# Patient Record
Sex: Female | Born: 1975
Health system: Southern US, Community
[De-identification: ages and names within clinical notes are randomized; demographics above are authoritative.]

## PROBLEM LIST (undated history)

## (undated) DIAGNOSIS — F419 Anxiety disorder, unspecified: Secondary | ICD-10-CM

## (undated) DIAGNOSIS — E785 Hyperlipidemia, unspecified: Secondary | ICD-10-CM

## (undated) DIAGNOSIS — K219 Gastro-esophageal reflux disease without esophagitis: Secondary | ICD-10-CM

## (undated) DIAGNOSIS — G4730 Sleep apnea, unspecified: Secondary | ICD-10-CM

## (undated) DIAGNOSIS — F32A Depression, unspecified: Secondary | ICD-10-CM

## (undated) DIAGNOSIS — F411 Generalized anxiety disorder: Secondary | ICD-10-CM

## (undated) DIAGNOSIS — M797 Fibromyalgia: Secondary | ICD-10-CM

## (undated) DIAGNOSIS — D649 Anemia, unspecified: Secondary | ICD-10-CM

## (undated) DIAGNOSIS — Z8619 Personal history of other infectious and parasitic diseases: Secondary | ICD-10-CM

## (undated) DIAGNOSIS — F329 Major depressive disorder, single episode, unspecified: Secondary | ICD-10-CM

## (undated) DIAGNOSIS — K2970 Gastritis, unspecified, without bleeding: Secondary | ICD-10-CM

## (undated) HISTORY — DX: Gastritis, unspecified, without bleeding: K29.70

## (undated) HISTORY — DX: Anemia, unspecified: D64.9

## (undated) HISTORY — DX: Gastro-esophageal reflux disease without esophagitis: K21.9

## (undated) HISTORY — DX: Anxiety disorder, unspecified: F41.9

## (undated) HISTORY — DX: Major depressive disorder, single episode, unspecified: F32.9

## (undated) HISTORY — DX: Hyperlipidemia, unspecified: E78.5

## (undated) HISTORY — DX: Generalized anxiety disorder: F41.1

## (undated) HISTORY — DX: Depression, unspecified: F32.A

## (undated) HISTORY — DX: Personal history of other infectious and parasitic diseases: Z86.19

## (undated) HISTORY — DX: Sleep apnea, unspecified: G47.30

---

## 2015-06-08 ENCOUNTER — Encounter (HOSPITAL_BASED_OUTPATIENT_CLINIC_OR_DEPARTMENT_OTHER): Payer: Self-pay

## 2015-06-08 ENCOUNTER — Emergency Department (HOSPITAL_BASED_OUTPATIENT_CLINIC_OR_DEPARTMENT_OTHER)
Admission: EM | Admit: 2015-06-08 | Discharge: 2015-06-08 | Disposition: A | Discharge status: Home / Self Care | Payer: Self-pay | Attending: Emergency Medicine | Admitting: Emergency Medicine

## 2015-06-08 DIAGNOSIS — Z8739 Personal history of other diseases of the musculoskeletal system and connective tissue: Secondary | ICD-10-CM | POA: Insufficient documentation

## 2015-06-08 DIAGNOSIS — J029 Acute pharyngitis, unspecified: Secondary | ICD-10-CM | POA: Insufficient documentation

## 2015-06-08 DIAGNOSIS — Z3202 Encounter for pregnancy test, result negative: Secondary | ICD-10-CM | POA: Insufficient documentation

## 2015-06-08 HISTORY — DX: Fibromyalgia: M79.7

## 2015-06-08 LAB — URINALYSIS, ROUTINE W REFLEX MICROSCOPIC
BILIRUBIN URINE: NEGATIVE
Glucose, UA: NEGATIVE mg/dL
Hgb urine dipstick: NEGATIVE
Ketones, ur: 15 mg/dL — AB
NITRITE: NEGATIVE
PH: 7.5 (ref 5.0–8.0)
Protein, ur: NEGATIVE mg/dL
SPECIFIC GRAVITY, URINE: 1.014 (ref 1.005–1.030)

## 2015-06-08 LAB — PREGNANCY, URINE: PREG TEST UR: NEGATIVE

## 2015-06-08 LAB — URINE MICROSCOPIC-ADD ON

## 2015-06-08 MED ORDER — KETOROLAC TROMETHAMINE 60 MG/2ML IM SOLN
30.0000 mg | Freq: Once | INTRAMUSCULAR | Status: AC
  Administered 2015-06-08: 30 mg via INTRAMUSCULAR
  Filled 2015-06-08: qty 2

## 2015-06-08 MED ORDER — IBUPROFEN 600 MG PO TABS: 600.0000 mg | ORAL_TABLET | Freq: Four times a day (QID) | ORAL | Status: DC | PRN

## 2015-06-08 NOTE — ED Notes (Addendum)
Step not collect pt able to open mouth but biting on swab abd and tongue depresser, pushing my hands away, unable to get strep

## 2015-06-08 NOTE — ED Provider Notes (Signed)
CSN: [id number]     Arrival date & time 06/08/15  1252 History   First MD Initiated Contact with Patient 06/08/15 1343     Chief Complaint  Patient presents with  . Sore Throat     (Consider location/radiation/quality/duration/timing/severity/associated sxs/prior Treatment) HPI Mary Powers is a 40 y.o. female who comes in for evaluation of sore throat. Patient is Spanish speaking and interpretation given by husband at bedside. Reports patient has had sore throat gradually worsening over the past one week. Reports associated nausea, decreased by mouth intake. Has not tried anything to improve symptoms. Swallowing worsened symptoms. No fevers, chills, shortness of breath, difficulty swallowing. No change in phonation. No drooling. No other modifying factors.  Past Medical History  Diagnosis Date  . Fibromyalgia    History reviewed. No pertinent past surgical history. No family history on file. Social History  Substance Use Topics  . Smoking status: Never Smoker   . Smokeless tobacco: None  . Alcohol Use: No   OB History    No data available     Review of Systems A 10 point review of systems was completed and was negative except for pertinent positives and negatives as mentioned in the history of present illness     Allergies  Review of patient's allergies indicates no known allergies.  Home Medications   Prior to Admission medications   Medication Sig Start Date End Date Taking? Authorizing Provider  ibuprofen (ADVIL,MOTRIN) 600 MG tablet Take 1 tablet (600 mg total) by mouth every 6 (six) hours as needed. 06/08/15   Zakirah Weingart, PA-C   BP 134/79 mmHg  Pulse 72  Temp(Src) 98 F (36.7 C) (Oral)  Resp 18  Ht 5' (1.524 m)  Wt 50.349 kg  BMI 21.68 kg/m2  SpO2 100%  LMP 06/03/2015 Physical Exam  Constitutional: She is oriented to person, place, and time. She appears well-developed and well-nourished.  HENT:  Head: Normocephalic and atraumatic.   Mouth/Throat: Oropharynx is clear and moist. No oropharyngeal exudate.  Mildly erythematous posterior oropharynx with no unilateral tonsillar swelling, trismus, glossal elevation. Mucous membranes are moist and she is tolerating secretions well. Mallampati 1  Eyes: Conjunctivae are normal. Pupils are equal, round, and reactive to light. Right eye exhibits no discharge. Left eye exhibits no discharge. No scleral icterus.  Neck: Normal range of motion. Neck supple.  Cardiovascular: Normal rate, regular rhythm and normal heart sounds.   Pulmonary/Chest: Effort normal and breath sounds normal. No respiratory distress. She has no wheezes. She has no rales.  Abdominal: Soft. There is no tenderness.  Musculoskeletal: She exhibits no tenderness.  Lymphadenopathy:    She has no cervical adenopathy.  Neurological: She is alert and oriented to person, place, and time.  Cranial Nerves II-XII grossly intact  Skin: Skin is warm and dry. No rash noted.  Psychiatric: She has a normal mood and affect.  Nursing note and vitals reviewed.   ED Course  Procedures (including critical care time) Labs Review Labs Reviewed  URINALYSIS, ROUTINE W REFLEX MICROSCOPIC (NOT AT [facility]) - Abnormal; Notable for the following:    Ketones, ur 15 (*)    Leukocytes, UA MODERATE (*)    All other components within normal limits  URINE MICROSCOPIC-ADD ON - Abnormal; Notable for the following:    Squamous Epithelial / LPF 0-5 (*)    Bacteria, UA FEW (*)    All other components within normal limits  RAPID STREP SCREEN (NOT AT [facility])  PREGNANCY, URINE    Imaging Review  No results found. I have personally reviewed and evaluated these images and lab results as part of my medical decision-making.   EKG Interpretation None     Meds given in ED:  Medications  ketorolac (TORADOL) injection 30 mg (30 mg Intramuscular Given 06/08/15 1420)    Discharge Medication List as of 06/08/2015  2:23 PM    START taking these  medications   Details  ibuprofen (ADVIL,MOTRIN) 600 MG tablet Take 1 tablet (600 mg total) by mouth every 6 (six) hours as needed., Starting 06/08/2015, Until Discontinued, Print       Filed Vitals:   06/08/15 1313 06/08/15 1425  BP: 131/73 134/79  Pulse: 121 72  Temp: 98.6 F (37 C) 98 F (36.7 C)  TempSrc: Oral Oral  Resp: 18 18  Height: 5' (1.524 m)   Weight: 50.349 kg   SpO2: 100% 100%    MDM  Pt afebrile without tonsillar exudate, cervical adenopathy. Low Centor criteria, no swab indicated. Diagnosis of viral pharyngitis. No abx indicated. DC w symptomatic tx for pain  Pt does not appear dehydrated, but did discuss importance of water rehydration. Presentation non concerning for PTA or infxn spread to soft tissue. No trismus or uvula deviation. Specific return precautions discussed. Pt able to drink water in ED without difficulty with intact air way. Recommended PCP follow up. Final diagnoses:  Sore throat        Joycie Peek, PA-C 06/08/15 1623  Melene Plan, DO 06/09/15 1805

## 2015-06-08 NOTE — ED Notes (Addendum)
Sore throat, decreased po intake x 1 week-not feeling well x 1 month-NAD-husband with pt-interpreter

## 2015-06-08 NOTE — ED Notes (Signed)
Pt unable to tolerate strep screen qtips due to nausea

## 2015-06-08 NOTE — Discharge Instructions (Signed)
Please take your medications as prescribed to help with your sore throat. [facility] in order to establish primary care. Return to ED for any new or worsening symptoms.  Dolor de garganta  (Sore Throat)  El dolor de garganta es el dolor, ardor, irritacin o sensacin de picazn en la garganta. Generalmente hay dolor o molestias al tragar o hablar. Un dolor de garganta puede estar acompaado de otros sntomas, como tos, estornudos, fiebre y ganglios hinchados en el cuello. Generalmente es Financial risk analyst signo de otra enfermedad, como un resfrio, gripe, anginas o mononucleosis (conocida como mono). La mayor parte de los dolores de garganta desaparecen sin tratamiento mdico. CAUSAS  Las causas ms comunes de dolor de garganta son:   Infecciones virales, como un resfrio, gripe o mononucleosis.  Infeccin bacteriana, como faringitis estreptoccica, amigdalitis, o tos ferina.  Alergias estacionales.  La sequedad en el aire.  Algunos irritantes, como el humo o la polucin.  Reflujo gastroesofgico. INSTRUCCIONES PARA EL CUIDADO EN EL HOGAR   Tome slo la medicacin que le indic el mdico.  Debe ingerir gran cantidad de lquido para mantener la orina de tono claro o color amarillo plido.  Descanse todo lo que sea necesario.  Trate de usar [employer] para la garganta, pastillas o chupe caramelos duros para Engineer, materials (si es mayor de 4 aos o segn lo que le indiquen).  Beba lquidos calientes, como caldos, infusiones de hierbas o agua caliente con miel para calmar el dolor momentneamente. Tambin puede comer o beber lquidos fros o congelados tales como paletas de hielo congelado.  Haga grgaras con agua con sal (mezclar 1 cucharadita de sal en 8 onzas [250 cm3] de agua).  No fume, y evite el humo de otros fumadores.  Ponga un humidificador de vapor fro en la habitacin por la noche para humedecer el aire. Tambin se puede activar en una  ducha de agua caliente y sentarse en el bao con la puerta cerrada durante 5-10 minutos. SOLICITE ATENCIN MDICA DE INMEDIATO SI:   Tiene dificultad para respirar.  No puede tragar lquidos, alimentos blandos, o su saliva.  Usted tiene ms inflamacin en la garganta.  El dolor de garganta no mejora en [address].  Tiene nuseas o vmitos.  Tiene fiebre o sntomas que persisten durante ms de 2 o 3 das.  Tiene fiebre y los sntomas empeoran de manera sbita. ASEGRESE DE QUE:   Comprende estas instrucciones.  Controlar su enfermedad.  Solicitar ayuda de inmediato si no mejora o si empeora.   Esta informacin no tiene Theme park manager el consejo del mdico. Asegrese de hacerle al mdico cualquier pregunta que tenga.   Document Released: 05/02/2005 Document Revised: 04/18/2012 Elsevier Interactive Patient Education [employer]

## 2015-06-10 ENCOUNTER — Encounter (HOSPITAL_BASED_OUTPATIENT_CLINIC_OR_DEPARTMENT_OTHER): Payer: Self-pay

## 2015-06-10 ENCOUNTER — Emergency Department (HOSPITAL_BASED_OUTPATIENT_CLINIC_OR_DEPARTMENT_OTHER)
Admission: EM | Admit: 2015-06-10 | Discharge: 2015-06-10 | Disposition: A | Discharge status: Home / Self Care | Payer: Self-pay | Attending: Emergency Medicine | Admitting: Emergency Medicine

## 2015-06-10 DIAGNOSIS — Z8739 Personal history of other diseases of the musculoskeletal system and connective tissue: Secondary | ICD-10-CM | POA: Insufficient documentation

## 2015-06-10 DIAGNOSIS — R070 Pain in throat: Secondary | ICD-10-CM | POA: Insufficient documentation

## 2015-06-10 DIAGNOSIS — R1310 Dysphagia, unspecified: Secondary | ICD-10-CM | POA: Insufficient documentation

## 2015-06-10 LAB — CBG MONITORING, ED: GLUCOSE-CAPILLARY: 95 mg/dL (ref 65–99)

## 2015-06-10 LAB — RAPID STREP SCREEN (MED CTR MEBANE ONLY): STREPTOCOCCUS, GROUP A SCREEN (DIRECT): NEGATIVE

## 2015-06-10 MED ORDER — DEXAMETHASONE 6 MG PO TABS
10.0000 mg | ORAL_TABLET | Freq: Once | ORAL | Status: AC
  Administered 2015-06-10: 10 mg via ORAL
  Filled 2015-06-10: qty 1

## 2015-06-10 NOTE — ED Notes (Signed)
Pt ambulating without any difficulty, wearing sunglasses and talking on phone.

## 2015-06-10 NOTE — ED Notes (Signed)
Husband reports pt is having chest pressure and hands are cold with shaking-pt was not able to tolerate strep scree yesterday-done today cooperation after discussion

## 2015-06-10 NOTE — ED Provider Notes (Signed)
CSN: [id number]     Arrival date & time 06/10/15  1953 History  By signing my name below, I, Linus Galas, attest that this documentation has been prepared under the direction and in the presence of Melene Plan, DO. Electronically Signed: Linus Galas, ED Scribe. 06/10/2015. 9:17 PM.   Chief Complaint  Patient presents with  . Multiple c/o    The history is provided by the spouse. A language interpreter was used (husband).   HPI Comments: Mary Powers is a 40 y.o. female with a PMHx of fibromyalgia who presents to the Emergency Department complaining of constant, ongoing sore throat for the past couple of months worsening 1 day ago. As per family, pt tried to eat something today and felt as if was getting stuck in her throat. Pt denies any exacerbating or alleviating factors. She also reports vomiting and body aches.Pt husbands states the pt was evaluated here yesterday for the same complaint and was sent home with an Rx for "throat spray" and ibuprofen. Pt denies fever, chills, cough, congestion, SOB, drooling or any other sx. Pt denies h/o HIV or AIDS  Past Medical History  Diagnosis Date  . Fibromyalgia    No past surgical history on file. No family history on file. Social History  Substance Use Topics  . Smoking status: Never Smoker   . Smokeless tobacco: None  . Alcohol Use: No   OB History    No data available     Review of Systems  Constitutional: Negative for fever and chills.  HENT: Positive for sore throat and trouble swallowing. Negative for congestion and rhinorrhea.   Eyes: Negative for redness and visual disturbance.  Respiratory: Negative for shortness of breath and wheezing.   Cardiovascular: Negative for chest pain and palpitations.  Gastrointestinal: Positive for vomiting. Negative for nausea.  Genitourinary: Negative for dysuria and urgency.  Musculoskeletal: Positive for myalgias. Negative for arthralgias.  Skin: Negative for pallor and wound.   Neurological: Negative for dizziness and headaches.   Allergies  Review of patient's allergies indicates no known allergies.  Home Medications   Prior to Admission medications   Medication Sig Start Date End Date Taking? Authorizing Provider  ibuprofen (ADVIL,MOTRIN) 600 MG tablet Take 1 tablet (600 mg total) by mouth every 6 (six) hours as needed. 06/08/15   Benjamin Cartner, PA-C   BP 130/71 mmHg  Pulse 108  Temp(Src) 98.7 F (37.1 C) (Oral)  Resp 18  Ht 5' (1.524 m)  Wt 111 lb (50.349 kg)  BMI 21.68 kg/m2  SpO2 100%  LMP 06/03/2015 Physical Exam  Constitutional: She is oriented to person, place, and time. She appears well-developed and well-nourished. No distress.  HENT:  Head: Normocephalic and atraumatic.  Mouth/Throat: Uvula is midline and oropharynx is clear and moist.  No drooling, no swelling in posterior oropharynx, no signs of poor oral dentition, no sublingual swelling   Eyes: EOM are normal. Pupils are equal, round, and reactive to light.  Neck: Normal range of motion. Neck supple.  Not in sniffing position, able to rotate neck without difficulty   Cardiovascular: Normal rate and regular rhythm.  Exam reveals no gallop and no friction rub.   No murmur heard. Pulmonary/Chest: Effort normal. She has no wheezes. She has no rales.  Abdominal: Soft. She exhibits no distension. There is no tenderness.  Musculoskeletal: She exhibits no edema or tenderness.  Lymphadenopathy:    She has no cervical adenopathy.  Neurological: She is alert and oriented to person, place, and time.  Skin: Skin is warm and dry. She is not diaphoretic.  Psychiatric: She has a normal mood and affect. Her behavior is normal.  Nursing note and vitals reviewed.   ED Course  Procedures  DIAGNOSTIC STUDIES: Oxygen Saturation is 100% on room air, normal by my interpretation.    COORDINATION OF CARE: 9:17 PM Will order EKG, rapid strep, CBG and culture. Discussed treatment plan with pt at  bedside and pt agreed to plan.   Labs Review Labs Reviewed  RAPID STREP SCREEN (NOT AT [facility])  CULTURE, GROUP A STREP ([facility])  CBG MONITORING, ED    Imaging Review No results found. I have personally reviewed and evaluated these images and lab results as part of my medical decision-making.   EKG Interpretation None      MDM   Final diagnoses:  None    40 yo F with sensation like she can swallow.  This has been going on for a couple months.  No infectious symptoms, not drooling, well appearing, non toxic.  Handling her secretions.  No signs of swelling or infectious process to the posterior oropharynx.  Patient recently seen here for same with negative strep.  As patient not drooling, feel she should be able to swallow, language barrier makes it difficult to obtain exact symptoms.  As process is not acute, feel unlike to be RPA, ludwigs, PTA, epiglottitis will have patient follow up with PCP with possible need to refer to ENT for scope.   I have discussed the diagnosis/risks/treatment options with the patient and family and believe the pt to be eligible for discharge home to follow-up with PCP. We also discussed returning to the ED immediately if new or worsening sx occur. We discussed the sx which are most concerning (e.g., sudden worsening pain, fever, inability to tolerate by mouth) that necessitate immediate return. Medications administered to the patient during their visit and any new prescriptions provided to the patient are listed below.  Medications given during this visit Medications  dexamethasone (DECADRON) tablet 10 mg (10 mg Oral Given 06/10/15 2134)    Discharge Medication List as of 06/10/2015  9:29 PM      The patient appears reasonably screen and/or stabilized for discharge and I doubt any other medical condition or other [facility] requiring further screening, evaluation, or treatment in the ED at this time prior to discharge.     I personally performed the services  described in this documentation, which was scribed in my presence. The recorded information has been reviewed and is accurate.     Melene Plan, DO 06/11/15 1407

## 2015-06-10 NOTE — Discharge Instructions (Signed)
Dolor sin causa aparente °(Pain Without a Known Cause) °¿QUÉ ES EL DOLOR SIN CAUSA APARENTE? °El dolor puede presentarse en cualquier parte del cuerpo y puede ser de leve a grave. En algunos casos, las causas del dolor se desconocen. Algunos tipos de dolor que pueden ocurrir sin causa aparente incluyen los siguientes:  °· Dolor de cabeza. °· Dolor de espalda. °· Dolor abdominal. °· Dolor en el cuello. °¿CÓMO SE DIAGNOSTICA EL DOLOR SIN CAUSA APARENTE?  °Su médico tratará de determinar la causa del dolor. Para esto, realizará lo siguiente: °· Examen físico. °· Historia clínica. °· Análisis de sangre. °· Análisis de orina. °· Radiografías. °Si no se descubre ninguna causa, el médico puede diagnosticarle "dolor sin causa aparente".  °¿HAY TRATAMIENTO PARA EL DOLOR SIN CAUSA APARENTE?  °El tratamiento depende del tipo de dolor que tenga. El médico puede recetar medicamentos para ayudar a calmar el dolor.  °¿QUÉ PUEDO HACER EN MI CASA PARA CONTROLAR EL DOLOR?  °· Tome los medicamentos solamente como se lo haya indicado el médico. °· Interrumpa las actividades que le causen dolor. En los momentos que sienta dolor intenso, haga reposo en cama. °· Trate de reducir el estrés realizando actividades, como yoga o meditación. Hable con su médico para que le dé otras recomendaciones sobre cómo reducir el estrés con la actividad física. °· Haga ejercicio regularmente si el médico lo autoriza. °· Siga una dieta saludable que incluya frutas y verduras. Esto puede mejorar el dolor. Hable con el médico si tiene alguna pregunta sobre la dieta. °¿QUÉ PASA SI EL DOLOR NO MEJORA?  °Si tiene mucho dolor y no se encuentran los motivos de dicho dolor o este empeora, es importante que realice un seguimiento con su médico. Puede ser necesario repetir las pruebas y realizar estudios más profundos para hallar la posible causa.  °  °Esta información no tiene como fin reemplazar el consejo del médico. Asegúrese de hacerle al médico cualquier  pregunta que tenga. °  °Document Released: 02/09/2005 Document Revised: 05/23/2014 °Elsevier Interactive Patient Education ©2016 Elsevier Inc. ° °

## 2015-06-10 NOTE — ED Notes (Signed)
Pt able to swallow pills with water without issue.  Verbalizes understanding of dc instructions and denies any further needs at this time.

## 2015-06-10 NOTE — ED Notes (Signed)
Per husband interpreter pt reports unable to swallow-pt is not drooling and is able to handle secretions-pt was seen yesterday for sore throat-pt NAD-steady gait

## 2015-06-10 NOTE — ED Notes (Signed)
Pt c/o painful swallowing and husband states she is unable to eat.  Pt's mucosa is moist, no drooling and she speaks without any issue.

## 2015-06-12 ENCOUNTER — Other Ambulatory Visit: Payer: Self-pay | Admitting: Internal Medicine

## 2015-06-12 DIAGNOSIS — R1310 Dysphagia, unspecified: Secondary | ICD-10-CM

## 2015-06-13 LAB — CULTURE, GROUP A STREP (THRC)

## 2015-06-15 ENCOUNTER — Ambulatory Visit
Admission: RE | Admit: 2015-06-15 | Discharge: 2015-06-15 | Disposition: A | Discharge status: Home / Self Care | Payer: No Typology Code available for payment source | Source: Ambulatory Visit | Attending: Internal Medicine | Admitting: Internal Medicine

## 2015-06-15 DIAGNOSIS — R1310 Dysphagia, unspecified: Secondary | ICD-10-CM

## 2015-08-22 ENCOUNTER — Emergency Department (HOSPITAL_BASED_OUTPATIENT_CLINIC_OR_DEPARTMENT_OTHER)
Admission: EM | Admit: 2015-08-22 | Discharge: 2015-08-23 | Disposition: A | Discharge status: Home / Self Care | Payer: No Typology Code available for payment source | Attending: Emergency Medicine | Admitting: Emergency Medicine

## 2015-08-22 ENCOUNTER — Encounter (HOSPITAL_BASED_OUTPATIENT_CLINIC_OR_DEPARTMENT_OTHER): Payer: Self-pay | Admitting: *Deleted

## 2015-08-22 DIAGNOSIS — Z8619 Personal history of other infectious and parasitic diseases: Secondary | ICD-10-CM | POA: Insufficient documentation

## 2015-08-22 DIAGNOSIS — Z8739 Personal history of other diseases of the musculoskeletal system and connective tissue: Secondary | ICD-10-CM | POA: Insufficient documentation

## 2015-08-22 DIAGNOSIS — Z3202 Encounter for pregnancy test, result negative: Secondary | ICD-10-CM | POA: Insufficient documentation

## 2015-08-22 DIAGNOSIS — R630 Anorexia: Secondary | ICD-10-CM | POA: Insufficient documentation

## 2015-08-22 DIAGNOSIS — R42 Dizziness and giddiness: Secondary | ICD-10-CM | POA: Insufficient documentation

## 2015-08-22 LAB — URINALYSIS, ROUTINE W REFLEX MICROSCOPIC
BILIRUBIN URINE: NEGATIVE
Glucose, UA: NEGATIVE mg/dL
Hgb urine dipstick: NEGATIVE
Ketones, ur: NEGATIVE mg/dL
LEUKOCYTES UA: NEGATIVE
NITRITE: NEGATIVE
PH: 7 (ref 5.0–8.0)
Protein, ur: NEGATIVE mg/dL
SPECIFIC GRAVITY, URINE: 1.004 — AB (ref 1.005–1.030)

## 2015-08-22 LAB — CBG MONITORING, ED: GLUCOSE-CAPILLARY: 75 mg/dL (ref 65–99)

## 2015-08-22 LAB — PREGNANCY, URINE: PREG TEST UR: NEGATIVE

## 2015-08-22 MED ORDER — SODIUM CHLORIDE 0.9 % IV BOLUS (SEPSIS)
1000.0000 mL | Freq: Once | INTRAVENOUS | Status: AC
  Administered 2015-08-22: 1000 mL via INTRAVENOUS

## 2015-08-22 MED ORDER — MECLIZINE HCL 25 MG PO TABS
25.0000 mg | ORAL_TABLET | Freq: Once | ORAL | Status: AC
  Administered 2015-08-23: 25 mg via ORAL
  Filled 2015-08-22: qty 1

## 2015-08-22 NOTE — ED Notes (Signed)
Pt here with husband who is interpreting. He states she was diagnosed with H-pylori a month ago and has had decreased appetite since. Yesterday was working with husband and became dizzy.

## 2015-08-22 NOTE — ED Provider Notes (Signed)
CSN: [id number]     Arrival date & time 08/22/15  2109 History   First MD Initiated Contact with Patient 08/22/15 2306     Chief Complaint  Patient presents with  . Dizziness    Mary Powers is a 40 y.o. female Who presents to the emergency department with her husband complaining of positional lightheadedness since yesterday. Patient reports she's been feeling lightheaded with position change as well as dizziness. She also thinks she has some room spinning intermittently.  Patient also reports for the past couple weeks she has had decreased appetite. She's not been eating and drinking as much as she usually does. She reports she was diagnosed with H. pylori and was treated for this. She reports since being treated for H. pylori she's had decreased appetite. Patient reports lightheadedness when she bends down and stands up or with other position change.  She denies fevers, numbness, weakness, headache, double vision, neck pain, chest pain, shortness of breath, abdominal pain, vomiting, syncope, diarrhea, or rashes.  Patient is a 40 y.o. female presenting with dizziness. The history is provided by the patient and the spouse. The history is limited by a language barrier. A language interpreter was used.  Dizziness Associated symptoms: no chest pain, no diarrhea, no headaches, no nausea, no shortness of breath, no vomiting and no weakness     Past Medical History  Diagnosis Date  . Fibromyalgia    History reviewed. No pertinent past surgical history. History reviewed. No pertinent family history. Social History  Substance Use Topics  . Smoking status: Never Smoker   . Smokeless tobacco: None  . Alcohol Use: No   OB History    No data available     Review of Systems  Constitutional: Negative for fever and chills.  HENT: Negative for congestion and sore throat.   Eyes: Negative for visual disturbance.  Respiratory: Negative for cough and shortness of breath.    Cardiovascular: Negative for chest pain.  Gastrointestinal: Negative for nausea, vomiting, abdominal pain and diarrhea.  Genitourinary: Negative for dysuria and difficulty urinating.  Musculoskeletal: Negative for back pain and neck pain.  Skin: Negative for rash.  Neurological: Positive for dizziness and light-headedness. Negative for syncope, weakness, numbness and headaches.      Allergies  Review of patient's allergies indicates no known allergies.  Home Medications   Prior to Admission medications   Medication Sig Start Date End Date Taking? Authorizing Provider  ibuprofen (ADVIL,MOTRIN) 600 MG tablet Take 1 tablet (600 mg total) by mouth every 6 (six) hours as needed. 06/08/15   Joycie PeekBenjamin Cartner, PA-C  meclizine (ANTIVERT) 25 MG tablet Take 1 tablet (25 mg total) by mouth 3 (three) times daily as needed for dizziness. 08/23/15   Everlene FarrierWilliam Kimie Pidcock, PA-C   BP 112/61 mmHg  Pulse 87  Temp(Src) 99.1 F (37.3 C) (Oral)  Resp 18  Ht 5\' 3"  (1.6 m)  Wt 46.267 kg  BMI 18.07 kg/m2  SpO2 100%  LMP 08/08/2015 Physical Exam  Constitutional: She is oriented to person, place, and time. She appears well-developed and well-nourished. No distress.  Nontoxic appearing.  HENT:  Head: Normocephalic and atraumatic.  Right Ear: External ear normal.  Left Ear: External ear normal.  Mouth/Throat: Oropharynx is clear and moist.  Eyes: Conjunctivae and EOM are normal. Pupils are equal, round, and reactive to light. Right eye exhibits no discharge. Left eye exhibits no discharge.  Neck: Normal range of motion. Neck supple. No JVD present. No tracheal deviation present.  Cardiovascular:  Normal rate, regular rhythm, normal heart sounds and intact distal pulses.  Exam reveals no gallop and no friction rub.   No murmur heard. Pulmonary/Chest: Effort normal and breath sounds normal. No respiratory distress. She has no wheezes. She has no rales.  Abdominal: Soft. She exhibits no distension. There is no  tenderness. There is no guarding.  Musculoskeletal: She exhibits no edema or tenderness.  Lymphadenopathy:    She has no cervical adenopathy.  Neurological: She is alert and oriented to person, place, and time. No cranial nerve deficit. Coordination normal.  The patient is alert and oriented 3. Cranial nerves are intact. Sensation intact her bilateral upper and lower extremities. Speech is clear and coherent. No pronator drift. Finger to nose intact bilaterally. Normal gait. Vision is grossly intact. EOMs are intact.  Skin: Skin is warm and dry. No rash noted. She is not diaphoretic. No erythema. No pallor.  Psychiatric: She has a normal mood and affect. Her behavior is normal.  Nursing note and vitals reviewed.   ED Course  Procedures (including critical care time) Labs Review Labs Reviewed  URINALYSIS, ROUTINE W REFLEX MICROSCOPIC (NOT AT [facility]) - Abnormal; Notable for the following:    Specific Gravity, Urine 1.004 (*)    All other components within normal limits  PREGNANCY, URINE  CBG MONITORING, ED    Imaging Review No results found. I have personally reviewed and evaluated these  lab results as part of my medical decision-making.   EKG Interpretation None     Filed Vitals:   08/22/15 2127 08/23/15 0009  BP: 119/56 112/61  Pulse: 96 87  Temp: 99.1 F (37.3 C)   TempSrc: Oral   Resp: 16 18  Height:  (1.6 m)   Weight: 46.267 kg   SpO2: 100% 100%    MDM   Meds given in ED:  Medications  sodium chloride 0.9 % bolus 1,000 mL (1,000 mLs Intravenous New Bag/Given 08/22/15 2359)  meclizine (ANTIVERT) tablet 25 mg (25 mg Oral Given 08/23/15 0011)    New Prescriptions   MECLIZINE (ANTIVERT) 25 MG TABLET    Take 1 tablet (25 mg total) by mouth 3 (three) times daily as needed for dizziness.    Final diagnoses:  Dizziness    This is a 40 y.o. female Who presents to the emergency department with her husband complaining of positional lightheadedness since yesterday.  Patient reports she's been feeling lightheaded with position change as well as dizziness. She also thinks she has some room spinning intermittently.  Patient also reports for the past couple weeks she has had decreased appetite. She's not been eating and drinking as much as she usually does. She reports she was diagnosed with H. pylori and was treated for this. She reports since being treated for H. pylori she's had decreased appetite. Patient reports lightheadedness when she bends down and stands up or with other position change.  She denies fevers, numbness, weakness, headache. On exam the patient is afebrile and nontoxic-appearing. She has no focal neurological deficits. She has normal gait. Initially I suspected dehydration with the patient reporting she is not drinking as much as usual. However, patient's urinalysis returned with a specific gravity of 1.004. This makes me think that dehydration is less likely. Pregnancy test is negative. Orthostatic vital signs did show that her heart rate increased from 72 while sitting to 101 while standing. She was not orthostatic. This does make me question dehydration again. Patient was provided with flu bolus and meclizine 25 mg.  At reevaluation patient reports feeling much better. She stood for me without becoming tachycardic. She denies feeling lightheaded or dizzy with this change in position. She is able to ambulate in the room with normal gait. I question if this is more vertigo. Will discharge with prescription for meclizine and I encouraged her to stay well hydrated and drink 6-8 glasses of water per day. I encouraged her to follow-up closely with her primary care provider. She reports she does have primary care but she is unsure of the name of her physician. I encouraged her to follow-up with him this week. I discussed her concerns specific return precautions. I advised the patient to follow-up with their primary care provider this week. I advised the patient  to return to the emergency department with new or worsening symptoms or new concerns. The patient verbalized understanding and agreement with plan.             Everlene Farrier, PA-C 08/23/15 0101  Paula Libra, MD 08/23/15 [phone]

## 2015-08-23 MED ORDER — MECLIZINE HCL 25 MG PO TABS: 25.0000 mg | ORAL_TABLET | Freq: Three times a day (TID) | ORAL | Status: DC | PRN

## 2015-08-23 NOTE — Discharge Instructions (Signed)
Vrtigo posicional benigno (Benign Positional Vertigo) El vrtigo es la sensacin de que usted o todo lo que lo rodea se mueven cuando en realidad eso no sucede. El vrtigo posicional benigno es el tipo de vrtigo ms comn. La causa de este trastorno no es grave (es benigna). Algunos movimientos y determinadas posiciones pueden desencadenar el trastorno (es posicional). El vrtigo posicional benigno puede ser peligroso si ocurre mientras est haciendo algo que podra suponer un riesgo para usted y para los dems, por ejemplo, conduciendo un automvil.  CAUSAS En muchos de los [place], se desconoce la causa de este trastorno. Puede deberse a Passenger transport manager zona del odo interno que ayuda al cerebro a percibir el movimiento y a Neurosurgeon equilibrio. Esta alteracin puede deberse a una infeccin viral (laberintitis), a una lesin en la cabeza o a los movimientos reiterados. FACTORES DE RIESGO Es ms probable que esta afeccin se manifieste en:  Las mujeres.  Mary Powers. SNTOMAS Generalmente, los sntomas de este trastorno se presentan al mover la cabeza o los ojos en diferentes direcciones. Pueden aparecer repentinamente y suelen durar menos de un minuto. Entre los sntomas se pueden incluir los siguientes:  Prdida del equilibrio y cadas.  Sensacin de estar dando vueltas o movindose.  Sensacin de que el entorno est dando vueltas o movindose.  Nuseas y vmitos.  Visin borrosa.  Mareos.  Movimientos oculares involuntarios (nistagmo). Los sntomas pueden ser leves y algo fastidiosos, o pueden ser graves e interferir en la vida cotidiana. Los episodios de vrtigo posicional benigno pueden repetirse (ser recurrentes) a lo largo del [place], y algunos movimientos pueden desencadenarlos. Los sntomas pueden mejorar con Physiological scientist. DIAGNSTICO Generalmente, este trastorno se diagnostica con una historia clnica y un examen fsico de la cabeza, el cuello y los  odos. Tal vez lo deriven a [place] en problemas de la garganta, la nariz y el odo (otorrinolaringlogo), o a uno que se especializa en trastornos del sistema nervioso (neurlogo). Pueden hacerle otros estudios, entre ellos:  Health visitor.  Tomografa computarizada.  Estudios de los [employer]. El mdico puede pedirle que cambie rpidamente de posicin mientras observa si se presentan sntomas de vrtigo posicional benigno, por ejemplo, nistagmo. Los movimientos oculares se pueden estudiar con una electronistagmografa (ENG), con estimulacin trmica, mediante la maniobra de [place] o con la prueba de rotacin.  Electroencefalograma (EEG). Este estudio registra la actividad elctrica del cerebro.  Pruebas de audicin. Kennis Carina, para tratar este trastorno, el mdico le har movimientos especficos con la cabeza para que el odo interno se normalice. [employer] casos son graves, tal vez haya que realizar una ciruga, pero esto no es frecuente. En algunos casos, el vrtigo posicional benigno se resuelve por s solo en el trmino de 2 o 4semanas. INSTRUCCIONES PARA EL CUIDADO EN EL HOGAR Seguridad  Muvase lentamente.No haga movimientos bruscos con el cuerpo o con la cabeza.  No conduzca.  No opere maquinaria pesada.  No haga ninguna tarea que podra ser peligrosa para usted o para Producer, television/film/video en caso de que ocurriera un episodio de vrtigo.  Si tiene dificultad para caminar o mantener el equilibrio, use un bastn para Consulting civil engineer estabilidad. Si se siente mareado o inestable, sintese de inmediato.  Reanude sus actividades normales como se lo haya indicado el mdico. Pregntele al mdico qu actividades son seguras para usted. Instrucciones generales  [employer] de venta libre y los recetados solamente como se lo haya indicado el mdico.  Evite algunas posiciones o determinados movimientos como se lo haya indicado el  mdico.  Beba suficiente lquido para mantener la orina clara o de color amarillo plido.  Concurra a todas las visitas de control como se lo haya indicado el mdico. Esto es importante. SOLICITE ATENCIN MDICA SI:  Tiene fiebre.  El trastorno empeora, o le aparecen sntomas nuevos.  Sus familiares o amigos advierten cambios en su comportamiento.  Las nuseas o los vmitos empeoran.  Tiene sensacin de hormigueo o de adormecimiento. SOLICITE ATENCIN MDICA DE INMEDIATO SI:  Tiene dificultad para hablar o para moverse.  Esta mareado todo el tiempo.  Se desmaya.  Tiene dolores de cabeza intensos.  Tiene debilidad en los brazos o las piernas.  Tiene cambios en la audicin o la visin.  Siente rigidez en el cuello.  Tiene sensibilidad a la luz.   Esta informacin no tiene como fin reemplazar el consejo del mdico. Asegrese de hacerle al mdico cualquier pregunta que tenga.   Document Released: 08/18/2008 Document Revised: 01/21/2015 Elsevier Interactive Patient Education 2016 Elsevier Inc. Mareos (Dizziness) Los mareos son un problema muy frecuente. Se trata de una sensacin de inestabilidad o de desvanecimiento. Puede sentir que se va a desmayar. Un mareo puede provocarle una lesin si se tropieza o se cae. Las personas de todas las edades pueden sufrir mareos, pero es ms frecuente en los adultos mayores. Esta afeccin puede tener muchas causas, entre las que se pueden mencionar los medicamentos, la deshidratacin y las enfermedades. INSTRUCCIONES PARA EL CUIDADO EN EL HOGAR Estas indicaciones pueden ayudarlo con el trastorno: Comida y bebida  Beba suficiente lquido para mantener la orina clara o de color amarillo plido. Esto evita la deshidratacin. Trate de beber ms lquidos transparentes, como agua.  No beba alcohol.  Limite el consumo de cafena si el mdico se lo indica.  Limite el consumo de sal si el mdico se lo indica. Actividad  Evite los  movimientos rpidos.  Levntese de las sillas con lentitud y apyese hasta sentirse bien.  Por la maana, sintese primero a un lado de la cama. Cuando se sienta bien, pngase lentamente de pie mientras se sostiene de algo, hasta que sepa que ha logrado el equilibrio.  Mueva las piernas con frecuencia si debe estar de pie en un lugar durante mucho tiempo. Mientras est de pie, contraiga y relaje los msculos de las piernas.  No conduzca vehculos ni opere maquinaria pesada si se siente mareado.  Evite agacharse si se siente mareado. En su casa, coloque los objetos de modo que le resulte fcil alcanzarlos sin agacharse. Estilo de vida  No consuma ningn producto que contenga tabaco, lo que incluye cigarrillos, tabaco de mascar o cigarrillos electrnicos. Si necesita ayuda para dejar de fumar, consulte al mdico.  Trate de reducir el nivel de estrs practicando actividades como el yoga o la meditacin. Hable con el mdico si necesita ayuda. Instrucciones generales  Controle sus mareos para ver si hay cambios.  Tome los medicamentos solamente como se lo haya indicado el mdico. Hable con el mdico si cree que los medicamentos que est tomando son la causa de sus mareos.  Infrmele a un amigo o a un familiar si se siente mareado. Pdale a esta persona que llame al mdico si observa cambios en su comportamiento.  Concurra a todas las visitas de control como se lo haya indicado el mdico. Esto es importante. SOLICITE ATENCIN MDICA SI:  Los mareos persisten.  Los mareos o la sensacin de desvanecimiento empeoran.    empeoran.  Siente nuseas.  Ha perdido la audicin.  Aparecen nuevos sntomas.  Cuando est de pie se siente inestable o que la habitacin da vueltas. SOLICITE ATENCIN MDICA DE INMEDIATO SI:  Vomita o tiene diarrea y no puede comer ni beber nada.  Tiene dificultad para hablar, para caminar, para tragar o para Boeingusar los brazos, las manos o las piernas.  Siente una debilidad  generalizada.  No piensa con claridad o tiene dificultades para armar oraciones. Es posible que un amigo o un familiar adviertan que esto ocurre.  Tiene dolor de pecho, dolor abdominal, sudoracin o Company secretaryle falta el aire.  Hay cambios en la visin.  Observa un sangrado.  Tiene dolores de Turkmenistancabeza.  Tiene dolor o rigidez en el cuello.  Tiene fiebre.   Esta informacin no tiene Theme park managercomo fin reemplazar el consejo del mdico. Asegrese de hacerle al mdico cualquier pregunta que tenga.   Document Released: 05/02/2005 Document Revised: 09/16/2014 Elsevier Interactive Patient Education [employer]2016 Elsevier Inc.

## 2015-08-23 NOTE — ED Notes (Signed)
Pt given d/c instructions as per chart. Verbalizes understanding. No questions. 

## 2015-09-11 ENCOUNTER — Encounter: Payer: Self-pay | Admitting: Gastroenterology

## 2015-09-11 HISTORY — PX: ESOPHAGOGASTRODUODENOSCOPY: SHX1529

## 2015-09-14 ENCOUNTER — Encounter (HOSPITAL_BASED_OUTPATIENT_CLINIC_OR_DEPARTMENT_OTHER): Payer: Self-pay

## 2015-09-14 ENCOUNTER — Emergency Department (HOSPITAL_BASED_OUTPATIENT_CLINIC_OR_DEPARTMENT_OTHER)
Admission: EM | Admit: 2015-09-14 | Discharge: 2015-09-15 | Disposition: A | Discharge status: Home / Self Care | Payer: Self-pay | Attending: Emergency Medicine | Admitting: Emergency Medicine

## 2015-09-14 ENCOUNTER — Emergency Department (HOSPITAL_BASED_OUTPATIENT_CLINIC_OR_DEPARTMENT_OTHER): Payer: Self-pay

## 2015-09-14 DIAGNOSIS — R1310 Dysphagia, unspecified: Secondary | ICD-10-CM

## 2015-09-14 DIAGNOSIS — K210 Gastro-esophageal reflux disease with esophagitis: Secondary | ICD-10-CM | POA: Insufficient documentation

## 2015-09-14 DIAGNOSIS — R21 Rash and other nonspecific skin eruption: Secondary | ICD-10-CM | POA: Insufficient documentation

## 2015-09-14 DIAGNOSIS — K219 Gastro-esophageal reflux disease without esophagitis: Secondary | ICD-10-CM

## 2015-09-14 LAB — CBC WITH DIFFERENTIAL/PLATELET
BASOS PCT: 0 %
Basophils Absolute: 0 10*3/uL (ref 0.0–0.1)
Eosinophils Absolute: 0.2 10*3/uL (ref 0.0–0.7)
Eosinophils Relative: 4 %
HEMATOCRIT: 31.4 % — AB (ref 36.0–46.0)
HEMOGLOBIN: 10.6 g/dL — AB (ref 12.0–15.0)
LYMPHS ABS: 1.1 10*3/uL (ref 0.7–4.0)
LYMPHS PCT: 22 %
MCH: 31.8 pg (ref 26.0–34.0)
MCHC: 33.8 g/dL (ref 30.0–36.0)
MCV: 94.3 fL (ref 78.0–100.0)
MONO ABS: 0.5 10*3/uL (ref 0.1–1.0)
Monocytes Relative: 9 %
NEUTROS ABS: 3.4 10*3/uL (ref 1.7–7.7)
NEUTROS PCT: 65 %
Platelets: 265 10*3/uL (ref 150–400)
RBC: 3.33 MIL/uL — ABNORMAL LOW (ref 3.87–5.11)
RDW: 11.7 % (ref 11.5–15.5)
WBC: 5.2 10*3/uL (ref 4.0–10.5)

## 2015-09-14 MED ORDER — SODIUM CHLORIDE 0.9 % IV SOLN: INTRAVENOUS | Status: DC

## 2015-09-14 MED ORDER — PANTOPRAZOLE SODIUM 40 MG IV SOLR
40.0000 mg | Freq: Once | INTRAVENOUS | Status: AC
  Administered 2015-09-14: 40 mg via INTRAVENOUS
  Filled 2015-09-14: qty 40

## 2015-09-14 MED ORDER — SODIUM CHLORIDE 0.9 % IV BOLUS (SEPSIS)
1000.0000 mL | Freq: Once | INTRAVENOUS | Status: AC
  Administered 2015-09-14: 1000 mL via INTRAVENOUS

## 2015-09-14 NOTE — ED Notes (Addendum)
Husband states pt with fatigue x today and SOB x 2-3 days-pt NAD-steady gait-pt speaks minimal english/husband speaking for pt-husband later added that pt had endoscopy last week in [place] for c/o feeling like something in her throat-was dx with GERD with rx prilosec

## 2015-09-14 NOTE — ED Provider Notes (Addendum)
CSN: [id number]     Arrival date & time 09/14/15  2103 History  By signing my name below, I, Linna DarnerRussell Turner, attest that this documentation has been prepared under the direction and in the presence of physician practitioner, Vanetta MuldersScott Jerico Grisso, MD. Electronically Signed: Linna Darnerussell Turner, Scribe. 09/14/2015. 10:27 PM.   Chief Complaint  Patient presents with  . Fatigue     The history is provided by the patient and the spouse. No language interpreter was used.     HPI Comments: Mary Powers is a 40 y.o. female with no significant PMHx who presents to the Emergency Department with her husband complaining of constant, severe, worsening, trouble swallowing for the last 7 months. Pt notes associated fatigue, SOB, choking due to difficulty swallowing, weight loss due to difficulty eating, nausea, dizziness, headache, chest pain, postnasal drip, congestion, blurry vision, and chills with choking. Pt notes that she has experienced SOB for around one month. She reports that she has lost almost 20 pounds due to decreased food intake due to her symptoms. Pt had an upper endoscopy performed 4 days ago in [place], [place] due to her swallowing issues. She notes that there were no immediate findings from her endoscopy but biopsies were taken. Pt is currently on Prilosec. Pt denies vomiting, diarrhea, fever, or any other associated symptoms.  Past Medical History  Diagnosis Date  . Fibromyalgia    History reviewed. No pertinent past surgical history. No family history on file. Social History  Substance Use Topics  . Smoking status: Never Smoker   . Smokeless tobacco: None  . Alcohol Use: No   OB History    No data available     Review of Systems  Constitutional: Positive for chills and fatigue. Negative for fever.  HENT: Positive for congestion, postnasal drip and trouble swallowing. Negative for rhinorrhea and sore throat.   Eyes: Positive for visual disturbance (blurry vision).   Respiratory: Positive for shortness of breath. Negative for cough.   Cardiovascular: Positive for chest pain.  Gastrointestinal: Positive for nausea. Negative for vomiting, abdominal pain and diarrhea.  Genitourinary: Negative for dysuria.  Musculoskeletal: Negative for back pain, joint swelling and neck pain.  Skin: Positive for rash.  Neurological: Positive for dizziness and headaches.  Hematological: Does not bruise/bleed easily.  Psychiatric/Behavioral: Negative for confusion.    Allergies  Review of patient's allergies indicates no known allergies.  Home Medications   Prior to Admission medications   Medication Sig Start Date End Date Taking? Authorizing Provider  ibuprofen (ADVIL,MOTRIN) 600 MG tablet Take 1 tablet (600 mg total) by mouth every 6 (six) hours as needed. 06/08/15   Joycie PeekBenjamin Cartner, PA-C  meclizine (ANTIVERT) 25 MG tablet Take 1 tablet (25 mg total) by mouth 3 (three) times daily as needed for dizziness. 08/23/15   Everlene FarrierWilliam Dansie, PA-C   BP 110/56 mmHg  Pulse 95  Temp(Src) 99.3 F (37.4 C) (Oral)  Resp 16  Ht 5' (1.524 m)  Wt 42.638 kg  BMI 18.36 kg/m2  SpO2 100%  LMP 08/27/2015 Physical Exam  Constitutional: She is oriented to person, place, and time. She appears well-developed and well-nourished. No distress.  HENT:  Head: Normocephalic and atraumatic.  Mouth/Throat: Mucous membranes are dry.  White coating on tongue.  Mucous membranes mildly dry.  Eyes: Conjunctivae and EOM are normal. Pupils are equal, round, and reactive to light.  Sclera are clear bilaterally. Eyes track normally.  Neck: Neck supple. No tracheal deviation present.  Cardiovascular: Normal rate, regular rhythm and normal  heart sounds.   Pulmonary/Chest: Effort normal and breath sounds normal. No respiratory distress.  Abdominal: Soft. Bowel sounds are normal.  Abdomen is flat and soft.  Musculoskeletal: Normal range of motion.       Right ankle: She exhibits no swelling.        Left ankle: She exhibits no swelling.  Neurological: She is alert and oriented to person, place, and time. She has normal reflexes. She displays normal reflexes. She exhibits normal muscle tone. Coordination normal.  Skin: Skin is warm and dry.  Psychiatric: She has a normal mood and affect. Her behavior is normal.  Nursing note and vitals reviewed.   ED Course  Procedures (including critical care time)  DIAGNOSTIC STUDIES: Oxygen Saturation is 100% on RA, normal by my interpretation.    COORDINATION OF CARE: 10:27 PM Discussed treatment plan with pt at bedside and pt agreed to plan.  Medications  0.9 %  sodium chloride infusion (not administered)  sodium chloride 0.9 % bolus 1,000 mL (not administered)  pantoprazole (PROTONIX) injection 40 mg (not administered)    Results for orders placed or performed during the hospital encounter of 08/22/15  Urinalysis, Routine w reflex microscopic (not at [facility])  Result Value Ref Range   Color, Urine YELLOW YELLOW   APPearance CLEAR CLEAR   Specific Gravity, Urine 1.004 (L) 1.005 - 1.030   pH 7.0 5.0 - 8.0   Glucose, UA NEGATIVE NEGATIVE mg/dL   Hgb urine dipstick NEGATIVE NEGATIVE   Bilirubin Urine NEGATIVE NEGATIVE   Ketones, ur NEGATIVE NEGATIVE mg/dL   Protein, ur NEGATIVE NEGATIVE mg/dL   Nitrite NEGATIVE NEGATIVE   Leukocytes, UA NEGATIVE NEGATIVE  Pregnancy, urine  Result Value Ref Range   Preg Test, Ur NEGATIVE NEGATIVE  CBG monitoring, ED  Result Value Ref Range   Glucose-Capillary 75 65 - 99 mg/dL   Dg Chest 2 View  05/21/1094  CLINICAL DATA:  Shortness of breath and fatigue for the past 3 days with left-sided chest pain. EXAM: CHEST  2 VIEW COMPARISON:  None. FINDINGS: Normal cardiac silhouette and mediastinal contours. The lungs are hyperexpanded with mild diffuse slightly nodular thickening of the pulmonary interstitium. No focal airspace opacities. No pleural effusion or pneumothorax. No evidence of edema. No acute osseus  abnormalities. IMPRESSION: Lung hyper expansion and bronchitic change without acute cardiopulmonary disease. Electronically Signed   By: Simonne Come M.D.   On: 09/14/2015 22:59     EKG Interpretation None      ED ECG REPORT   Date: 09/14/2015  Rate: 96  Rhythm: normal sinus rhythm  QRS Axis: normal  Intervals: normal  ST/T Wave abnormalities: normal  Conduction Disutrbances:none  Narrative Interpretation:   Old EKG Reviewed: none available MUSE Mismatch I have personally reviewed the EKG tracing and agree with the computerized printout as noted.    MDM   Final diagnoses:  Difficulty swallowing  Gastroesophageal reflux disease, esophagitis presence not specified    Patient with a history of chronic symptoms for which she's presenting with tonight. Symptoms been ongoing since October. Patient followed by GI medicine in [place] recently had an upper endoscopy with biopsies. As per the upper endoscopy did not show any gross findings. And they're waiting the results of the biopsy. Patient's had difficulty swallowing throat pain chest discomfort fatigue shortness of breath and weight loss and a feeling of choking since October.  Patient's chest x-ray without any acute findings. Of noted that with previous visits here since January there has not been  an extensive lab workup so that has been ordered. If these labs are negative and patient can be discharged home to follow-up with GI medicine in [place]. Patient also was diagnosed positive for H. pylori in the past. Patient is on Prilosec.  I personally performed the services described in this documentation, which was scribed in my presence. The recorded information has been reviewed and is accurate.     Vanetta Mulders, MD 09/14/15 1610  Vanetta Mulders, MD 09/14/15 2328

## 2015-09-14 NOTE — ED Notes (Signed)
Pt speaks minimal english.  pts husband at bedside.  Reports increased fatigue and 'stop breathing' episodes.  Reports endoscopy last week in [place]-denies knowing results yet.  States increased nausea when eating recently.  States that pt chokes on her own spit.  Pt noted to be laying on the side of the bed with her head hanging off the bed.  Pt repositioned, bed rail pulled up.

## 2015-09-15 LAB — LIPASE, BLOOD: LIPASE: 20 U/L (ref 11–51)

## 2015-09-15 LAB — COMPREHENSIVE METABOLIC PANEL
ALT: 12 U/L — AB (ref 14–54)
AST: 15 U/L (ref 15–41)
Albumin: 3.6 g/dL (ref 3.5–5.0)
Alkaline Phosphatase: 34 U/L — ABNORMAL LOW (ref 38–126)
Anion gap: 6 (ref 5–15)
BILIRUBIN TOTAL: 0.6 mg/dL (ref 0.3–1.2)
BUN: 7 mg/dL (ref 6–20)
CALCIUM: 7.9 mg/dL — AB (ref 8.9–10.3)
CHLORIDE: 113 mmol/L — AB (ref 101–111)
CO2: 20 mmol/L — ABNORMAL LOW (ref 22–32)
CREATININE: 0.59 mg/dL (ref 0.44–1.00)
Glucose, Bld: 96 mg/dL (ref 65–99)
Potassium: 3.4 mmol/L — ABNORMAL LOW (ref 3.5–5.1)
Sodium: 139 mmol/L (ref 135–145)
TOTAL PROTEIN: 6.1 g/dL — AB (ref 6.5–8.1)

## 2019-06-06 ENCOUNTER — Encounter (HOSPITAL_BASED_OUTPATIENT_CLINIC_OR_DEPARTMENT_OTHER): Payer: Self-pay | Admitting: Emergency Medicine

## 2019-06-06 ENCOUNTER — Other Ambulatory Visit: Payer: Self-pay

## 2019-06-06 ENCOUNTER — Emergency Department (HOSPITAL_BASED_OUTPATIENT_CLINIC_OR_DEPARTMENT_OTHER): Payer: No Typology Code available for payment source

## 2019-06-06 ENCOUNTER — Emergency Department (HOSPITAL_BASED_OUTPATIENT_CLINIC_OR_DEPARTMENT_OTHER)
Admission: EM | Admit: 2019-06-06 | Discharge: 2019-06-06 | Disposition: A | Discharge status: Home / Self Care | Payer: No Typology Code available for payment source | Attending: Emergency Medicine | Admitting: Emergency Medicine

## 2019-06-06 DIAGNOSIS — R0789 Other chest pain: Secondary | ICD-10-CM | POA: Insufficient documentation

## 2019-06-06 DIAGNOSIS — M797 Fibromyalgia: Secondary | ICD-10-CM | POA: Insufficient documentation

## 2019-06-06 DIAGNOSIS — T50995A Adverse effect of other drugs, medicaments and biological substances, initial encounter: Secondary | ICD-10-CM | POA: Insufficient documentation

## 2019-06-06 DIAGNOSIS — Z79899 Other long term (current) drug therapy: Secondary | ICD-10-CM | POA: Insufficient documentation

## 2019-06-06 DIAGNOSIS — Z888 Allergy status to other drugs, medicaments and biological substances status: Secondary | ICD-10-CM | POA: Insufficient documentation

## 2019-06-06 DIAGNOSIS — R0602 Shortness of breath: Secondary | ICD-10-CM | POA: Insufficient documentation

## 2019-06-06 DIAGNOSIS — T50905A Adverse effect of unspecified drugs, medicaments and biological substances, initial encounter: Secondary | ICD-10-CM

## 2019-06-06 DIAGNOSIS — F419 Anxiety disorder, unspecified: Secondary | ICD-10-CM | POA: Insufficient documentation

## 2019-06-06 LAB — BASIC METABOLIC PANEL
Anion gap: 11 (ref 5–15)
BUN: 12 mg/dL (ref 6–20)
CO2: 21 mmol/L — ABNORMAL LOW (ref 22–32)
Calcium: 9.2 mg/dL (ref 8.9–10.3)
Chloride: 103 mmol/L (ref 98–111)
Creatinine, Ser: 0.84 mg/dL (ref 0.44–1.00)
GFR calc Af Amer: >60 mL/min (ref >60)
GFR calc non Af Amer: >60 mL/min (ref >60)
Glucose, Bld: 139 mg/dL — ABNORMAL HIGH (ref 70–99)
Potassium: 3.3 mmol/L — ABNORMAL LOW (ref 3.5–5.1)
Sodium: 135 mmol/L (ref 135–145)

## 2019-06-06 LAB — CBC
HCT: 38.3 % (ref 36.0–46.0)
Hemoglobin: 12.6 g/dL (ref 12.0–15.0)
MCH: 31.5 pg (ref 26.0–34.0)
MCHC: 32.9 g/dL (ref 30.0–36.0)
MCV: 95.8 fL (ref 80.0–100.0)
Platelets: 312 10*3/uL (ref 150–400)
RBC: 4 MIL/uL (ref 3.87–5.11)
RDW: 12 % (ref 11.5–15.5)
WBC: 10.7 10*3/uL — ABNORMAL HIGH (ref 4.0–10.5)
nRBC: 0 % (ref 0.0–0.2)

## 2019-06-06 MED ORDER — HYDROXYZINE HCL 25 MG PO TABS: 25.0000 mg | ORAL_TABLET | Freq: Three times a day (TID) | ORAL | 0 refills | Status: DC | PRN

## 2019-06-06 MED ORDER — SODIUM CHLORIDE 0.9 % IV SOLN: 1000.0000 mL | INTRAVENOUS | Status: DC

## 2019-06-06 MED ORDER — SODIUM CHLORIDE 0.9 % IV BOLUS (SEPSIS)
1000.0000 mL | Freq: Once | INTRAVENOUS | Status: AC
  Administered 2019-06-06: 1000 mL via INTRAVENOUS

## 2019-06-06 MED ORDER — LORAZEPAM 2 MG/ML IJ SOLN
1.0000 mg | Freq: Once | INTRAMUSCULAR | Status: AC
  Administered 2019-06-06: 1 mg via INTRAVENOUS
  Filled 2019-06-06: qty 1

## 2019-06-06 MED ORDER — DIPHENHYDRAMINE HCL 50 MG/ML IJ SOLN
25.0000 mg | Freq: Once | INTRAMUSCULAR | Status: AC
  Administered 2019-06-06: 25 mg via INTRAVENOUS
  Filled 2019-06-06: qty 1

## 2019-06-06 MED ORDER — ONDANSETRON 8 MG PO TBDP: 8.0000 mg | ORAL_TABLET | Freq: Three times a day (TID) | ORAL | 0 refills | Status: DC | PRN

## 2019-06-06 MED FILL — hydrOXYzine HCL 25 MG TABS: 25 | 5 days supply | Qty: 15 | Fill #0

## 2019-06-06 MED FILL — ONDANSETRON ODT 8 MG TABLET: 8 | 4 days supply | Qty: 12 | Fill #0

## 2019-06-06 NOTE — Discharge Instructions (Signed)
Stop taking the Reglan.  You can take the hydroxyzine as needed for anxiety.  You can also take the Zofran as needed for nausea.  You can continue taking the antacid medication.  Consider following up with your primary care doctor.

## 2019-06-06 NOTE — ED Notes (Signed)
XR at bedside

## 2019-06-06 NOTE — ED Notes (Signed)
Patient is resting comfortably. 

## 2019-06-06 NOTE — ED Provider Notes (Signed)
[facility] EMERGENCY DEPARTMENT Provider Note   CSN: [id number] Arrival date & time: 06/06/19  1301     History Chief Complaint  Patient presents with  . Allergic Reaction    Mary Powers is a 44 y.o. female.  HPI   Patient was recently seen at [facility] for epigastric abdominal pain for about a week.  Patient was also having nausea and vomiting.  Patient was seen on January 19.  Patient had an evaluation that included laboratory tests as well as an abdominal ultrasound..  Patient was ultimately of diagnosed with abdominal pain and discharged home with omeprazole and Reglan.  Patient had received Reglan in the hospital.  Patient came to the ED because she started having trouble with discomfort in her chest, patient had a tightness in her chest was feeling more short of breath.  She also started feeling like her tongue was tight.  Her last dose of Reglan was at 0 900.  She has not noticed any rashes.  Past Medical History:  Diagnosis Date  . Fibromyalgia     There are no problems to display for this patient.   History reviewed. No pertinent surgical history.   OB History   No obstetric history on file.     No family history on file.  Social History   Tobacco Use  . Smoking status: Never Smoker  . Smokeless tobacco: Never Used  Substance Use Topics  . Alcohol use: No  . Drug use: No    Home Medications Prior to Admission medications   Medication Sig Start Date End Date Taking? Authorizing Provider  omeprazole (PRILOSEC) 20 MG capsule Take by mouth.   Yes [provider]  ondansetron (ZOFRAN) 4 MG tablet Take by mouth.   Yes [provider]  metoCLOPramide (REGLAN) 10 MG tablet Take by mouth. 06/04/19 06/06/19 Yes [provider]  hydrOXYzine (ATARAX/VISTARIL) 25 MG tablet Take 1 tablet (25 mg total) by mouth every 8 (eight) hours as needed for anxiety. 06/06/19   Linwood Dibbles, MD  ibuprofen  (ADVIL,MOTRIN) 600 MG tablet Take 1 tablet (600 mg total) by mouth every 6 (six) hours as needed. 06/08/15   Cartner, Sharlet Salina, PA-C  meclizine (ANTIVERT) 25 MG tablet Take 1 tablet (25 mg total) by mouth 3 (three) times daily as needed for dizziness. 08/23/15   Everlene Farrier, PA-C  ondansetron (ZOFRAN ODT) 8 MG disintegrating tablet Take 1 tablet (8 mg total) by mouth every 8 (eight) hours as needed for nausea or vomiting. 06/06/19   Linwood Dibbles, MD    Allergies    Zoloft [sertraline]  Review of Systems   Review of Systems  All other systems reviewed and are negative.   Physical Exam Updated Vital Signs BP 110/70 (BP Location: Right Arm)   Pulse 100   Temp 99.2 F (37.3 C) (Oral)   Resp 18   LMP 05/05/2019   SpO2 99%   Physical Exam Vitals and nursing note reviewed.  Constitutional:      General: She is not in acute distress.    Appearance: She is well-developed.  HENT:     Head: Normocephalic and atraumatic.     Comments: No uvula edema, no edema of the tongue, no oral lesions    Right Ear: External ear normal.     Left Ear: External ear normal.  Eyes:     General: No scleral icterus.       Right eye: No discharge.  Left eye: No discharge.     Conjunctiva/sclera: Conjunctivae normal.  Neck:     Trachea: No tracheal deviation.  Cardiovascular:     Rate and Rhythm: Regular rhythm. Tachycardia present.  Pulmonary:     Effort: Pulmonary effort is normal. No respiratory distress.     Breath sounds: Normal breath sounds. No stridor. No wheezing or rales.  Abdominal:     General: Bowel sounds are normal. There is no distension.     Palpations: Abdomen is soft.     Tenderness: There is no abdominal tenderness. There is no guarding or rebound.  Musculoskeletal:        General: No tenderness.     Cervical back: Neck supple.  Skin:    General: Skin is warm and dry.     Findings: No rash.  Neurological:     Mental Status: She is alert.     Cranial Nerves: No cranial  nerve deficit (no facial droop, extraocular movements intact, no slurred speech).     Sensory: No sensory deficit.     Motor: No abnormal muscle tone or seizure activity.     Coordination: Coordination normal.  Psychiatric:     Comments: Anxious     ED Results / Procedures / Treatments   Labs (all labs ordered are listed, but only abnormal results are displayed) Labs Reviewed  CBC - Abnormal; Notable for the following components:      Result Value   WBC 10.7 (*)    All other components within normal limits  BASIC METABOLIC PANEL - Abnormal; Notable for the following components:   Potassium 3.3 (*)    CO2 21 (*)    Glucose, Bld 139 (*)    All other components within normal limits    EKG EKG Interpretation  Date/Time:  Thursday June 06 2019 13:36:09 EST Ventricular Rate:  126 PR Interval:    QRS Duration: 78 QT Interval:  304 QTC Calculation: 441 R Axis:   88 Text Interpretation: Sinus tachycardia Since last tracing rate faster Confirmed by Dorie Rank [phone]) on 06/06/2019 1:47:18 PM   Radiology DG Chest Portable 1 View  Result Date: 06/06/2019 CLINICAL DATA:  Shortness of breath. EXAM: PORTABLE CHEST 1 VIEW COMPARISON:  09/14/2015. FINDINGS: Mediastinum and hilar structures normal. Lungs are clear. No pleural effusion or pneumothorax. Heart size normal. No acute bony abnormality identified. IMPRESSION: No acute cardiopulmonary disease. Electronically Signed   By: Marcello Moores  Register   On: 06/06/2019 14:17    Procedures Procedures (including critical care time)  Medications Ordered in ED Medications  sodium chloride 0.9 % bolus 1,000 mL (0 mLs Intravenous Stopped 06/06/19 1454)    Followed by  0.9 %  sodium chloride infusion (has no administration in time range)  diphenhydrAMINE (BENADRYL) injection 25 mg (25 mg Intravenous Given 06/06/19 1343)  LORazepam (ATIVAN) injection 1 mg (1 mg Intravenous Given 06/06/19 1345)    ED Course  I have reviewed the triage vital  signs and the nursing notes.  Pertinent labs & imaging results that were available during my care of the patient were reviewed by me and considered in my medical decision making (see chart for details).  Clinical Course as of Jun 05 1501  Thu Jun 06, 2019  1335 Spanish Ecologist used.  Tachycardia noted.  Appears to be sinus rhythm on the monitor.  Symptoms are suggestive of akathisia associated with her Reglan rather than angioedema.  I will give her a dose of Benadryl and Ativan.  Will check  labs and monitor.   [JK]  1451 Labs reviewed from [facility] facility.  Negative pregnancy test 2 days ago   [JK]    Clinical Course User Index [JK] Linwood Dibbles, MD   MDM Rules/Calculators/A&P                      Patient presented to the emergency room for evaluation of shortness of breath and a feeling that her tongue was enlarged.  On exam the patient did not have any evidence of angioedema.  I suspect her symptoms may have been partly related to anxiety as well as akathisia associated with her Reglan.  Patient was given Benadryl and dose of Ativan.  Her symptoms improved and she is feeling much better.  She did have an extensive evaluation at a [facility] facility 2 days ago and I was able to review that information.  At this time there does not appear to be any evidence of an acute emergency medical condition and the patient appears stable for discharge with appropriate outpatient follow up.  Final Clinical Impression(s) / ED Diagnoses Final diagnoses:  Adverse effect of drug, initial encounter  Anxiety    Rx / DC Orders ED Discharge Orders         Ordered    hydrOXYzine (ATARAX/VISTARIL) 25 MG tablet  Every 8 hours PRN     06/06/19 1458    ondansetron (ZOFRAN ODT) 8 MG disintegrating tablet  Every 8 hours PRN     06/06/19 1458           Linwood Dibbles, MD 06/06/19 1505

## 2019-06-06 NOTE — ED Triage Notes (Addendum)
Took Reglan at 0900, has had 3 doses, at 1000, feel like tongue was "tight" and chest tightness and SOB, Anxious

## 2019-06-18 ENCOUNTER — Other Ambulatory Visit: Payer: Self-pay

## 2019-06-18 ENCOUNTER — Emergency Department (HOSPITAL_BASED_OUTPATIENT_CLINIC_OR_DEPARTMENT_OTHER): Payer: No Typology Code available for payment source

## 2019-06-18 ENCOUNTER — Emergency Department (HOSPITAL_BASED_OUTPATIENT_CLINIC_OR_DEPARTMENT_OTHER)
Admission: EM | Admit: 2019-06-18 | Discharge: 2019-06-18 | Disposition: A | Discharge status: Home / Self Care | Payer: No Typology Code available for payment source | Attending: Emergency Medicine | Admitting: Emergency Medicine

## 2019-06-18 ENCOUNTER — Encounter (HOSPITAL_BASED_OUTPATIENT_CLINIC_OR_DEPARTMENT_OTHER): Payer: Self-pay

## 2019-06-18 DIAGNOSIS — B349 Viral infection, unspecified: Secondary | ICD-10-CM | POA: Insufficient documentation

## 2019-06-18 DIAGNOSIS — Z20822 Contact with and (suspected) exposure to covid-19: Secondary | ICD-10-CM | POA: Insufficient documentation

## 2019-06-18 DIAGNOSIS — R824 Acetonuria: Secondary | ICD-10-CM | POA: Insufficient documentation

## 2019-06-18 DIAGNOSIS — R112 Nausea with vomiting, unspecified: Secondary | ICD-10-CM | POA: Insufficient documentation

## 2019-06-18 DIAGNOSIS — M797 Fibromyalgia: Secondary | ICD-10-CM | POA: Insufficient documentation

## 2019-06-18 DIAGNOSIS — R079 Chest pain, unspecified: Secondary | ICD-10-CM

## 2019-06-18 DIAGNOSIS — R0602 Shortness of breath: Secondary | ICD-10-CM | POA: Insufficient documentation

## 2019-06-18 DIAGNOSIS — R05 Cough: Secondary | ICD-10-CM | POA: Insufficient documentation

## 2019-06-18 LAB — LIPASE, BLOOD: Lipase: 36 U/L (ref 11–51)

## 2019-06-18 LAB — CBC WITH DIFFERENTIAL/PLATELET
Abs Immature Granulocytes: 0.02 10*3/uL (ref 0.00–0.07)
Basophils Absolute: 0 10*3/uL (ref 0.0–0.1)
Basophils Relative: 0 %
Eosinophils Absolute: 0 10*3/uL (ref 0.0–0.5)
Eosinophils Relative: 0 %
HCT: 41.2 % (ref 36.0–46.0)
Hemoglobin: 13.5 g/dL (ref 12.0–15.0)
Immature Granulocytes: 0 %
Lymphocytes Relative: 13 %
Lymphs Abs: 1 10*3/uL (ref 0.7–4.0)
MCH: 31.3 pg (ref 26.0–34.0)
MCHC: 32.8 g/dL (ref 30.0–36.0)
MCV: 95.6 fL (ref 80.0–100.0)
Monocytes Absolute: 0.3 10*3/uL (ref 0.1–1.0)
Monocytes Relative: 4 %
Neutro Abs: 6 10*3/uL (ref 1.7–7.7)
Neutrophils Relative %: 83 %
Platelets: 301 10*3/uL (ref 150–400)
RBC: 4.31 MIL/uL (ref 3.87–5.11)
RDW: 12.1 % (ref 11.5–15.5)
WBC: 7.4 10*3/uL (ref 4.0–10.5)
nRBC: 0 % (ref 0.0–0.2)

## 2019-06-18 LAB — URINALYSIS, MICROSCOPIC (REFLEX)

## 2019-06-18 LAB — URINALYSIS, ROUTINE W REFLEX MICROSCOPIC
Bilirubin Urine: NEGATIVE
Glucose, UA: NEGATIVE mg/dL
Hgb urine dipstick: NEGATIVE
Ketones, ur: >80 mg/dL — AB
Nitrite: NEGATIVE
Protein, ur: NEGATIVE mg/dL
Specific Gravity, Urine: 1.015 (ref 1.005–1.030)
pH: 7.5 (ref 5.0–8.0)

## 2019-06-18 LAB — COMPREHENSIVE METABOLIC PANEL
ALT: 15 U/L (ref 0–44)
AST: 19 U/L (ref 15–41)
Albumin: 4.8 g/dL (ref 3.5–5.0)
Alkaline Phosphatase: 39 U/L (ref 38–126)
Anion gap: 11 (ref 5–15)
BUN: 10 mg/dL (ref 6–20)
CO2: 19 mmol/L — ABNORMAL LOW (ref 22–32)
Calcium: 9.2 mg/dL (ref 8.9–10.3)
Chloride: 104 mmol/L (ref 98–111)
Creatinine, Ser: 0.85 mg/dL (ref 0.44–1.00)
GFR calc Af Amer: >60 mL/min (ref >60)
GFR calc non Af Amer: >60 mL/min (ref >60)
Glucose, Bld: 117 mg/dL — ABNORMAL HIGH (ref 70–99)
Potassium: 3.5 mmol/L (ref 3.5–5.1)
Sodium: 134 mmol/L — ABNORMAL LOW (ref 135–145)
Total Bilirubin: 0.7 mg/dL (ref 0.3–1.2)
Total Protein: 7.8 g/dL (ref 6.5–8.1)

## 2019-06-18 LAB — SARS CORONAVIRUS 2 AG (30 MIN TAT): SARS Coronavirus 2 Ag: NEGATIVE

## 2019-06-18 LAB — LACTIC ACID, PLASMA: Lactic Acid, Venous: 1 mmol/L (ref 0.5–1.9)

## 2019-06-18 LAB — PREGNANCY, URINE: Preg Test, Ur: NEGATIVE

## 2019-06-18 LAB — TROPONIN I (HIGH SENSITIVITY): Troponin I (High Sensitivity): <2 ng/L (ref <18)

## 2019-06-18 LAB — D-DIMER, QUANTITATIVE (NOT AT ARMC): D-Dimer, Quant: <0.27 ug/mL-FEU (ref 0.00–0.50)

## 2019-06-18 MED ORDER — SODIUM CHLORIDE 0.9 % IV BOLUS
500.0000 mL | Freq: Once | INTRAVENOUS | Status: AC
  Administered 2019-06-18: 16:00:00 500 mL via INTRAVENOUS

## 2019-06-18 NOTE — ED Notes (Signed)
Pt on monitor 

## 2019-06-18 NOTE — ED Provider Notes (Signed)
[facility] DEPARTMENT Provider Note   CSN: [id number] Arrival date & time: 06/18/19  1459     History Chief Complaint  Patient presents with  . Chest Pain    Mary Powers is a 44 y.o. female with PMHx fibromyalgia who presents to the ED today complaining of gradual onset, constant, substernal chest pain that began today with SOB. Pt also complains of a dry cough x 1 week with chills. She reports she was tested for COVID 19 last week and was negative. She is denying any recent sick contacts or COVID 19 positive exposure. Pt reports she has also had about 1 episode of emesis per day for the past 2 weeks.   Per chart review pt was seen in the [facility] on 06/04/2019 for epigastric abdominal pain and diagnosed with gastritis. She was discharged home with Reglan and returned to the ED on 1/21 for what appeared to be akathisia associated with Reglan use. She was treated in the ED with Benadryl and Ativan with improvement of her symptoms and discharged home.   Pt denies fevers, hemoptysis, leg swelling, vomiting blood, diarrhea, constipation, urinary symptoms, or any other associated symptoms. No hx DVT/PE. No recent prolonged travel or immobilization. No exogenous hormone use - pt receives depo injections. No active malignancy. Pt reports FHx of CAD and states her mother has had an MI in the past but does not know how old she was. Pt is never smoker.   The history is provided by the patient and medical records. The history is limited by a language barrier. A language interpreter was used.       Past Medical History:  Diagnosis Date  . Fibromyalgia     There are no problems to display for this patient.   History reviewed. No pertinent surgical history.   OB History   No obstetric history on file.     No family history on file.  Social History   Tobacco Use  . Smoking status: Never Smoker  . Smokeless tobacco: Never Used  Substance Use  Topics  . Alcohol use: No  . Drug use: No    Home Medications Prior to Admission medications   Medication Sig Start Date End Date Taking? Authorizing Provider  hydrOXYzine (ATARAX/VISTARIL) 25 MG tablet Take 1 tablet (25 mg total) by mouth every 8 (eight) hours as needed for anxiety. 06/06/19   Dorie Rank, MD  ibuprofen (ADVIL,MOTRIN) 600 MG tablet Take 1 tablet (600 mg total) by mouth every 6 (six) hours as needed. 06/08/15   Cartner, Marland Kitchen, PA-C  meclizine (ANTIVERT) 25 MG tablet Take 1 tablet (25 mg total) by mouth 3 (three) times daily as needed for dizziness. 08/23/15   Waynetta Pean, PA-C  omeprazole (PRILOSEC) 20 MG capsule Take by mouth.    [provider]  ondansetron (ZOFRAN ODT) 8 MG disintegrating tablet Take 1 tablet (8 mg total) by mouth every 8 (eight) hours as needed for nausea or vomiting. 06/06/19   Dorie Rank, MD  ondansetron (ZOFRAN) 4 MG tablet Take by mouth.    [provider]  metoCLOPramide (REGLAN) 10 MG tablet Take by mouth. 06/04/19 06/06/19  [provider]    Allergies    Zoloft [sertraline]  Review of Systems   Review of Systems  Constitutional: Positive for chills and fatigue. Negative for fever.  HENT: Negative for congestion and sore throat.   Respiratory: Positive for cough and shortness of breath.   Cardiovascular: Positive for chest pain.  Negative for palpitations and leg swelling.  Gastrointestinal: Positive for nausea and vomiting. Negative for abdominal pain, constipation and diarrhea.  All other systems reviewed and are negative.   Physical Exam Updated Vital Signs BP 138/73 (BP Location: Left Arm)   Pulse (!) 118   Temp 98.9 F (37.2 C) (Oral)   Resp 18   SpO2 100%   Physical Exam Vitals and nursing note reviewed.  Constitutional:      Appearance: She is not ill-appearing.  HENT:     Head: Normocephalic and atraumatic.  Eyes:     Conjunctiva/sclera: Conjunctivae normal.  Cardiovascular:     Rate and  Rhythm: Regular rhythm. Tachycardia present.     Pulses:          Radial pulses are 2+ on the right side and 2+ on the left side.       Dorsalis pedis pulses are 2+ on the right side and 2+ on the left side.     Heart sounds: Normal heart sounds.  Pulmonary:     Effort: Pulmonary effort is normal.     Breath sounds: Normal breath sounds. No decreased breath sounds, wheezing, rhonchi or rales.  Chest:     Chest wall: No tenderness.  Abdominal:     Palpations: Abdomen is soft.     Tenderness: There is no abdominal tenderness. There is no guarding or rebound.  Musculoskeletal:     Cervical back: Neck supple.     Right lower leg: No tenderness. No edema.     Left lower leg: No tenderness. No edema.  Skin:    General: Skin is warm and dry.  Neurological:     Mental Status: She is alert.     ED Results / Procedures / Treatments   Labs (all labs ordered are listed, but only abnormal results are displayed) Labs Reviewed  COMPREHENSIVE METABOLIC PANEL - Abnormal; Notable for the following components:      Result Value   Sodium 134 (*)    CO2 19 (*)    Glucose, Bld 117 (*)    All other components within normal limits  URINALYSIS, ROUTINE W REFLEX MICROSCOPIC - Abnormal; Notable for the following components:   Ketones, ur >80 (*)    Leukocytes,Ua TRACE (*)    All other components within normal limits  URINALYSIS, MICROSCOPIC (REFLEX) - Abnormal; Notable for the following components:   Bacteria, UA RARE (*)    All other components within normal limits  SARS CORONAVIRUS 2 AG (30 MIN TAT)  NOVEL CORONAVIRUS, NAA (HOSP ORDER, SEND-OUT TO REF LAB; TAT 18-24 HRS)  LIPASE, BLOOD  CBC WITH DIFFERENTIAL/PLATELET  D-DIMER, QUANTITATIVE (NOT AT [facility])  LACTIC ACID, PLASMA  PREGNANCY, URINE  TROPONIN I (HIGH SENSITIVITY)    EKG EKG Interpretation  Date/Time:  Tuesday June 18 2019 15:04:53 EST Ventricular Rate:  115 PR Interval:  136 QRS Duration: 66 QT Interval:  298 QTC  Calculation: 412 R Axis:   58 Text Interpretation: Sinus tachycardia Otherwise normal ECG Confirmed by Vanetta Mulders [phone]) on 06/18/2019 3:14:05 PM   Radiology DG Chest Port 1 View  Result Date: 06/18/2019 CLINICAL DATA:  Chest pain and cough. EXAM: PORTABLE CHEST 1 VIEW COMPARISON:  June 06, 2019 FINDINGS: The heart size and mediastinal contours are within normal limits. Both lungs are clear. The visualized skeletal structures are unremarkable. IMPRESSION: No active disease. Electronically Signed   By: Aram Candela M.D.   On: 06/18/2019 15:55    Procedures Procedures (including critical care  time)  Medications Ordered in ED Medications  sodium chloride 0.9 % bolus 500 mL (0 mLs Intravenous Stopped 06/18/19 1646)    ED Course  I have reviewed the triage vital signs and the nursing notes.  Pertinent labs & imaging results that were available during my care of the patient were reviewed by me and considered in my medical decision making (see chart for details).  44 year old female who presents to the ED today complaining of chest pain that started today, cough for the past week.  She is in the room shivering stating she feels cold and is chilling.  On arrival to the ED patient is afebrile however tachycardic in the 110s.  Nontachypneic.  Satting 100% on room air and able to speak in full sentences without difficulty.  Will work-up for her chest pain today although I doubt ACS patient's symptoms sound atypical.  Given her tachycardia we will add on D-dimer at this time however she has no other risk factors concerning for PE.  She has equal pulses, doubt dissection.  Swab for Covid.  Will give fluids to hopefully bring down tachycardia.  CBC without leukocytosis. Hgb stable.  CMP with mild elevation in glucose 117, bicarb 19, normal anion gap. Creatinine stable compared to baseline Lactic within normal limits 1.0 Lipase within normal limits Initial trop < 2.  D dimer negative.    Rapid covid negative.  CXR clear.  Urine with > 80 ketones; pt without hx of diabetes and glucose not significantly elevated today. Doubt DKA. Suspect dehydration. Pt will follow up with PCP regarding this.   Suspect chest pain likely related to costocondritis. Pt advised to take Tylenol and Ibuprofen as needed.   On reeval pts heart rate has improved with small amount of fluids. She was ambulated in the ED and pulse ox remained at normal level. Will swab for covid and discharge home. Pt advised to self isolate until she receives her results. She is in agreement with plan and stable for discharge home.   This note was prepared using Dragon voice recognition software and may include unintentional dictation errors due to the inherent limitations of voice recognition software.  Mary Powers was evaluated in Emergency Department on 06/18/2019 for the symptoms described in the history of present illness. She was evaluated in the context of the global COVID-19 pandemic, which necessitated consideration that the patient might be at risk for infection with the SARS-CoV-2 virus that causes COVID-19. Institutional protocols and algorithms that pertain to the evaluation of patients at risk for COVID-19 are in a state of rapid change based on information released by regulatory bodies including the CDC and federal and state organizations. These policies and algorithms were followed during the patient's care in the ED.   Clinical Course as of Jun 17 1741  Tue Jun 18, 2019  1603 SARS Coronavirus 2 Ag: NEGATIVE [MV]  1625 Troponin I (High Sensitivity): <2 [MV]  1625 D-Dimer, Quant: <0.27 [MV]    Clinical Course User Index [MV] Tanda Rockers, PA-C   MDM Rules/Calculators/A&P  Final Clinical Impression(s) / ED Diagnoses Final diagnoses:  Viral illness  Nonspecific chest pain  Ketonuria    Rx / DC Orders ED Discharge Orders    None       Discharge Instructions     Your labwork  and imaging was very reassuring today. Your symptoms may be related to possible COVID 19 infection. We have tested you for this - please stay home and self isolate until you  receive your results.   If positive we will call you - you will need to stay home for 14 days. Cleared: 07/03/2019.  If negative you will not receive a call - please log into your mychart to check your results in about 2 days time. You can resume normal daily activity if negative.   Please follow up with your PCP regarding your ED visit today. Your urine did show signs of dehydration - increase your water intake for the next week and have your urine rechecked by your PCP in 1-2 weeks.        Tanda Rockers, PA-C 06/18/19 1743    Vanetta Mulders, MD 06/23/19 [phone]

## 2019-06-18 NOTE — ED Triage Notes (Signed)
Pt c/o CP x today-cough x 1 week-states she had neg covid last week-NAD-steady gait

## 2019-06-18 NOTE — ED Notes (Signed)
ED Provider at bedside. 

## 2019-06-18 NOTE — ED Notes (Addendum)
PT ambulated in room on room air. Sat maintained at 98-100%. No sob noted.

## 2019-06-18 NOTE — ED Notes (Signed)
Portable Xray at bedside.

## 2019-06-18 NOTE — Discharge Instructions (Addendum)
Your labwork and imaging was very reassuring today. Your symptoms may be related to possible COVID 19 infection. We have tested you for this - please stay home and self isolate until you receive your results.   If positive we will call you - you will need to stay home for 14 days. Cleared: 07/03/2019.  If negative you will not receive a call - please log into your mychart to check your results in about 2 days time. You can resume normal daily activity if negative.   Please follow up with your PCP regarding your ED visit today. Your urine did show signs of dehydration - increase your water intake for the next week and have your urine rechecked by your PCP in 1-2 weeks.

## 2019-06-19 LAB — NOVEL CORONAVIRUS, NAA (HOSP ORDER, SEND-OUT TO REF LAB; TAT 18-24 HRS): SARS-CoV-2, NAA: NOT DETECTED

## 2019-07-17 ENCOUNTER — Ambulatory Visit: Payer: No Typology Code available for payment source | Admitting: Internal Medicine

## 2019-07-19 ENCOUNTER — Ambulatory Visit: Payer: No Typology Code available for payment source | Admitting: Gastroenterology

## 2019-09-16 ENCOUNTER — Encounter: Payer: Self-pay | Admitting: Gastroenterology

## 2019-09-16 ENCOUNTER — Ambulatory Visit: Payer: No Typology Code available for payment source | Admitting: Gastroenterology

## 2019-09-16 VITALS — BP 106/64 | HR 125 | Temp 98.6°F | Ht 60.0 in | Wt 99.0 lb

## 2019-09-16 DIAGNOSIS — R112 Nausea with vomiting, unspecified: Secondary | ICD-10-CM

## 2019-09-16 DIAGNOSIS — K219 Gastro-esophageal reflux disease without esophagitis: Secondary | ICD-10-CM

## 2019-09-16 DIAGNOSIS — Z01818 Encounter for other preprocedural examination: Secondary | ICD-10-CM

## 2019-09-16 DIAGNOSIS — R1310 Dysphagia, unspecified: Secondary | ICD-10-CM

## 2019-09-16 DIAGNOSIS — R1013 Epigastric pain: Secondary | ICD-10-CM

## 2019-09-16 MED ORDER — PANTOPRAZOLE SODIUM 40 MG PO TBEC: 40.0000 mg | DELAYED_RELEASE_TABLET | Freq: Two times a day (BID) | ORAL | 0 refills | Status: DC

## 2019-09-16 NOTE — Patient Instructions (Signed)
You have been scheduled for an endoscopy. Please follow written instructions given to you at your visit today. If you use inhalers (even only as needed), please bring them with you on the day of your procedure. Your physician has requested that you go to www.startemmi.com and enter the access code given to you at your visit today. This web site gives a general overview about your procedure. However, you should still follow specific instructions given to you by our office regarding your preparation for the procedure.  We have sent the following medications to your pharmacy for you to pick up at your convenience: Protonix  It was a pleasure to see you today!  Vito Cirigliano, D.O.  

## 2019-09-16 NOTE — Progress Notes (Addendum)
Chief Complaint: Reflux, nausea/vomiting, epigastric pain, dysphagia, weight loss  Referring Provider:     Salvadore Farber, FNP  HPI:     Mary Powers is a 44 y.o. female with history of fibromyalgia, GAD, sleep apnea, referred to the Gastroenterology Clinic for evaluation of multiple GI symptoms, to include reflux symptoms, nausea/vomiting, epigastric pain, dysphagia, weight loss.  She states she has been having MEG, nausea, non-bloody emesis, pyrosis with regurgitation. Sxs started end of January with nausea/vomiting. Reflux sxs tend to be post prandial. Occasional foamy regurgitant. Trialed omeprazole, without any change. MEG pain with burning quality across upper abdomen. No radiation to back.shoulders. Has been eating small volumes to try to limit sxs.  Similar sxs approx 2016, evaluated with EGD in [place] with EGD- normal per patient. Sxs have actually been present intermittently for approx 10 years.   Also with dysphagia with solids and liquids. Points to suprasternal notch. No impactions.  Has had to regurgitate food back out.  +post prandial cough.   Has lost 20# over the last 3-4 months, mostly due to not wanting to eat for fear of exacerbating symptoms.  No change in bowel habits, hematochezia, melena.   Was seen by her PCM for this issue 06/10/2019.  Covid testing was negative.  Was prescribed pantoprazole 40 mg/day, sucralfate (not change in sxs).  Does report a prior history of H. pylori in 2017, which appears to have been treated with clarithromycin, amoxicillin, PPI x14 days.  Was then seen in the ER on 06/18/2019 chest pain.  Evaluation largely unrevealing to include CBC, CMP, lipase, lactate, troponin, D-dimer, coags, CXR.  Additionally seen for similar type symptoms in [facility] on 06/04/2019 (RUQ [place] normal. diagnosed with gastritis; discharged with Reglan) and 06/06/2019 (Akathisia due to Reglan; treated with Benadryl, Ativan).  No  known family history of CRC, GI malignancy, liver disease, pancreatic disease, or IBD.   No prior colonoscopy.   EGD (2016; [place]): Normal per patient  History obtained via interpreter in the room.   Past Medical History:  Diagnosis Date  . Anemia   . Anxiety   . Depressive disorder   . Fibromyalgia   . GAD (generalized anxiety disorder)   . Gastritis   . GERD (gastroesophageal reflux disease)   . History of Helicobacter pylori infection   . Hyperlipemia   . Sleep apnea      Past Surgical History:  Procedure Laterality Date  . ESOPHAGOGASTRODUODENOSCOPY     Around 2016. In [place]   Family History  Problem Relation Age of Onset  . Depression Mother   . Heart disease Mother   . Colon cancer Neg Hx   . Esophageal cancer Neg Hx    Social History   Tobacco Use  . Smoking status: Never Smoker  . Smokeless tobacco: Never Used  Substance Use Topics  . Alcohol use: No  . Drug use: No   Current Outpatient Medications  Medication Sig Dispense Refill  . hydrOXYzine (ATARAX/VISTARIL) 25 MG tablet Take 1 tablet (25 mg total) by mouth every 8 (eight) hours as needed for anxiety. (Patient not taking: Reported on 09/16/2019) 15 tablet 0  . ibuprofen (ADVIL,MOTRIN) 600 MG tablet Take 1 tablet (600 mg total) by mouth every 6 (six) hours as needed. (Patient not taking: Reported on 09/16/2019) 30 tablet 0  . meclizine (ANTIVERT) 25 MG tablet Take 1 tablet (25 mg total) by mouth 3 (three)  times daily as needed for dizziness. (Patient not taking: Reported on 09/16/2019) 30 tablet 0  . omeprazole (PRILOSEC) 20 MG capsule Take by mouth.    . ondansetron (ZOFRAN ODT) 8 MG disintegrating tablet Take 1 tablet (8 mg total) by mouth every 8 (eight) hours as needed for nausea or vomiting. (Patient not taking: Reported on 09/16/2019) 12 tablet 0  . ondansetron (ZOFRAN) 4 MG tablet Take by mouth.     No current facility-administered medications for this visit.   Allergies  Allergen  Reactions  . Lactose Intolerance (Gi)   . Other     Coffee  . Zoloft [Sertraline] Swelling     Review of Systems: All systems reviewed and negative except where noted in HPI.     Physical Exam:    Wt Readings from Last 3 Encounters:  09/16/19 99 lb (44.9 kg)  06/18/19 114 lb (51.7 kg)  09/14/15 94 lb (42.6 kg)    Temp 98.6 F (37 C)   Ht 5' (1.524 m)   Wt 99 lb (44.9 kg)   BMI 19.33 kg/m  Constitutional:  Pleasant, in no acute distress. Psychiatric: Normal mood and affect. Behavior is normal. EENT: Pupils normal.  Conjunctivae are normal. No scleral icterus. Neck supple. No cervical LAD. Cardiovascular: Normal rate, regular rhythm. No edema Pulmonary/chest: Effort normal and breath sounds normal. No wheezing, rales or rhonchi. Abdominal: Soft, nondistended, nontender. Bowel sounds active throughout. There are no masses palpable. No hepatomegaly. Neurological: Alert and oriented to person place and time. Skin: Skin is warm and dry. No rashes noted.   ASSESSMENT AND PLAN;   1) Dysphagia 2) Heartburn 3) Regurgitation 4) MEG pain 5) Nausea/Vomiting 6) Weight loss  -Trial of high-dose PPI with Protonix 40 mg bid, taken 30-60 minutes prior to breakfast and dinner - EGD with gastric/duodenal biopsies and esophageal dilation as appropriate -Evaluate for PUD, gastritis, erosive esophagitis, LES laxity, hiatal hernia, etc. at time of EGD as above -If EGD unrevealing no response to high-dose PPI, low threshold for cross-sectional imaging  The indications, risks, and benefits of EGD were explained to the patient in detail. Risks include but are not limited to bleeding, perforation, adverse reaction to medications, and cardiopulmonary compromise. Sequelae include but are not limited to the possibility of surgery, hositalization, and mortality. The patient verbalized understanding and wished to proceed. All questions answered, referred to scheduler. Further recommendations  pending results of the exam.      Shellia Cleverly, DO, FACG  09/16/2019, 10:59 AM   Rennis Harding, FNP    Addendum: Endoscopy report and pathology report from previous GI received and reviewed and notable for the following: -09/10/2015-seen by Dr. Oletha Blend with c/o progressive dysphagia starting in 02/2015, and worsening over the preceding couple of weeks.  Did have associated 31 pound weight loss x2 months.  Per notes, had a normal EGD with [facility] in 2011 -09/11/2015, EGD: Normal esophagus with benign biopsies to rule out EOE.  Empiric dilation with [address].  Normal stomach and duodenum

## 2019-09-24 ENCOUNTER — Other Ambulatory Visit: Payer: Self-pay | Admitting: Gastroenterology

## 2019-09-24 ENCOUNTER — Ambulatory Visit (INDEPENDENT_AMBULATORY_CARE_PROVIDER_SITE_OTHER): Payer: Self-pay

## 2019-09-24 DIAGNOSIS — Z1159 Encounter for screening for other viral diseases: Secondary | ICD-10-CM

## 2019-09-24 LAB — SARS CORONAVIRUS 2 (TAT 6-24 HRS): SARS Coronavirus 2: NEGATIVE

## 2019-09-26 ENCOUNTER — Other Ambulatory Visit: Payer: Self-pay | Admitting: Gastroenterology

## 2019-09-26 ENCOUNTER — Other Ambulatory Visit: Payer: Self-pay

## 2019-09-26 ENCOUNTER — Ambulatory Visit (AMBULATORY_SURGERY_CENTER): Payer: Self-pay | Admitting: Gastroenterology

## 2019-09-26 ENCOUNTER — Encounter: Payer: Self-pay | Admitting: Gastroenterology

## 2019-09-26 VITALS — BP 95/54 | HR 68 | Temp 97.3°F | Resp 16 | Ht 60.0 in | Wt 99.0 lb

## 2019-09-26 DIAGNOSIS — R1319 Other dysphagia: Secondary | ICD-10-CM

## 2019-09-26 DIAGNOSIS — R1013 Epigastric pain: Secondary | ICD-10-CM

## 2019-09-26 DIAGNOSIS — R12 Heartburn: Secondary | ICD-10-CM

## 2019-09-26 DIAGNOSIS — K2950 Unspecified chronic gastritis without bleeding: Secondary | ICD-10-CM

## 2019-09-26 DIAGNOSIS — R1310 Dysphagia, unspecified: Secondary | ICD-10-CM

## 2019-09-26 DIAGNOSIS — R112 Nausea with vomiting, unspecified: Secondary | ICD-10-CM

## 2019-09-26 DIAGNOSIS — K219 Gastro-esophageal reflux disease without esophagitis: Secondary | ICD-10-CM

## 2019-09-26 MED ORDER — SODIUM CHLORIDE 0.9 % IV SOLN: 500.0000 mL | Freq: Once | INTRAVENOUS | Status: DC

## 2019-09-26 NOTE — Progress Notes (Signed)
Called to room to assist during endoscopic procedure.  Patient ID and intended procedure confirmed with present staff. Received instructions for my participation in the procedure from the performing physician.  

## 2019-09-26 NOTE — Progress Notes (Signed)
A and O x3. Report to RN. Tolerated MAC anesthesia well.Teeth unchanged after procedure.

## 2019-09-26 NOTE — Patient Instructions (Signed)
Post dilation diet for today Continue present medications Await pathology results Return to GI clinic at appointment to be scheduled  YOU HAD AN ENDOSCOPIC PROCEDURE TODAY AT THE [facility]:   Refer to the procedure report that was given to you for any specific questions about what was found during the examination.  If the procedure report does not answer your questions, please call your gastroenterologist to clarify.  If you requested that your care partner not be given the details of your procedure findings, then the procedure report has been included in a sealed envelope for you to review at your convenience later.  YOU SHOULD EXPECT: Some feelings of bloating in the abdomen. Passage of more gas than usual.  Walking can help get rid of the air that was put into your GI tract during the procedure and reduce the bloating. If you had a lower endoscopy (such as a colonoscopy or flexible sigmoidoscopy) you may notice spotting of blood in your stool or on the toilet paper. If you underwent a bowel prep for your procedure, you may not have a normal bowel movement for a few days.  Please Note:  You might notice some irritation and congestion in your nose or some drainage.  This is from the oxygen used during your procedure.  There is no need for concern and it should clear up in a day or so.  SYMPTOMS TO REPORT IMMEDIATELY:   Following upper endoscopy (EGD)  Vomiting of blood or coffee ground material  New chest pain or pain under the shoulder blades  Painful or persistently difficult swallowing  New shortness of breath  Fever of 100F or higher  Black, tarry-looking stools  For urgent or emergent issues, a gastroenterologist can be reached at any hour by calling [phone]. Do not use MyChart messaging for urgent concerns.    DIET:  We do recommend a small meal at first, but then you may proceed to your regular diet.  Drink plenty of fluids but you should avoid alcoholic  beverages for 24 hours.  ACTIVITY:  You should plan to take it easy for the rest of today and you should NOT DRIVE or use heavy machinery until tomorrow (because of the sedation medicines used during the test).    FOLLOW UP: Our staff will call the number listed on your records 48-72 hours following your procedure to check on you and address any questions or concerns that you may have regarding the information given to you following your procedure. If we do not reach you, we will leave a message.  We will attempt to reach you two times.  During this call, we will ask if you have developed any symptoms of COVID 19. If you develop any symptoms (ie: fever, flu-like symptoms, shortness of breath, cough etc.) before then, please call [phone].  If you test positive for Covid 19 in the 2 weeks post procedure, please call and report this information to [place].    If any biopsies were taken you will be contacted by phone or by letter within the next 1-3 weeks.  Please call us at [phone] if you have not heard about the biopsies in 3 weeks.    SIGNATURES/CONFIDENTIALITY: You and/or your care partner have signed paperwork which will be entered into your electronic medical record.  These signatures attest to the fact that that the information above on your After Visit Summary has been reviewed and is understood.  Full responsibility of the confidentiality of this discharge  information lies with you and/or your care-partner. 

## 2019-09-26 NOTE — Progress Notes (Signed)
Pt's states no medical or surgical changes since previsit or office visit. 

## 2019-09-29 ENCOUNTER — Emergency Department (HOSPITAL_BASED_OUTPATIENT_CLINIC_OR_DEPARTMENT_OTHER)
Admission: EM | Admit: 2019-09-29 | Discharge: 2019-09-29 | Disposition: A | Discharge status: Home / Self Care | Payer: Self-pay | Attending: Emergency Medicine | Admitting: Emergency Medicine

## 2019-09-29 ENCOUNTER — Emergency Department (HOSPITAL_BASED_OUTPATIENT_CLINIC_OR_DEPARTMENT_OTHER): Payer: Self-pay

## 2019-09-29 ENCOUNTER — Encounter (HOSPITAL_BASED_OUTPATIENT_CLINIC_OR_DEPARTMENT_OTHER): Payer: Self-pay | Admitting: Emergency Medicine

## 2019-09-29 ENCOUNTER — Other Ambulatory Visit: Payer: Self-pay

## 2019-09-29 DIAGNOSIS — Z79899 Other long term (current) drug therapy: Secondary | ICD-10-CM | POA: Insufficient documentation

## 2019-09-29 DIAGNOSIS — R112 Nausea with vomiting, unspecified: Secondary | ICD-10-CM | POA: Insufficient documentation

## 2019-09-29 LAB — COMPREHENSIVE METABOLIC PANEL
ALT: 12 U/L (ref 0–44)
AST: 14 U/L — ABNORMAL LOW (ref 15–41)
Albumin: 4.6 g/dL (ref 3.5–5.0)
Alkaline Phosphatase: 38 U/L (ref 38–126)
Anion gap: 12 (ref 5–15)
BUN: 9 mg/dL (ref 6–20)
CO2: 22 mmol/L (ref 22–32)
Calcium: 9.1 mg/dL (ref 8.9–10.3)
Chloride: 102 mmol/L (ref 98–111)
Creatinine, Ser: 0.66 mg/dL (ref 0.44–1.00)
GFR calc Af Amer: >60 mL/min (ref >60)
GFR calc non Af Amer: >60 mL/min (ref >60)
Glucose, Bld: 126 mg/dL — ABNORMAL HIGH (ref 70–99)
Potassium: 3.6 mmol/L (ref 3.5–5.1)
Sodium: 136 mmol/L (ref 135–145)
Total Bilirubin: 0.7 mg/dL (ref 0.3–1.2)
Total Protein: 7.6 g/dL (ref 6.5–8.1)

## 2019-09-29 LAB — CBC WITH DIFFERENTIAL/PLATELET
Abs Immature Granulocytes: 0.03 10*3/uL (ref 0.00–0.07)
Basophils Absolute: 0 10*3/uL (ref 0.0–0.1)
Basophils Relative: 0 %
Eosinophils Absolute: 0.1 10*3/uL (ref 0.0–0.5)
Eosinophils Relative: 1 %
HCT: 37.8 % (ref 36.0–46.0)
Hemoglobin: 12.9 g/dL (ref 12.0–15.0)
Immature Granulocytes: 0 %
Lymphocytes Relative: 9 %
Lymphs Abs: 0.8 10*3/uL (ref 0.7–4.0)
MCH: 32.6 pg (ref 26.0–34.0)
MCHC: 34.1 g/dL (ref 30.0–36.0)
MCV: 95.5 fL (ref 80.0–100.0)
Monocytes Absolute: 0.4 10*3/uL (ref 0.1–1.0)
Monocytes Relative: 5 %
Neutro Abs: 7.9 10*3/uL — ABNORMAL HIGH (ref 1.7–7.7)
Neutrophils Relative %: 85 %
Platelets: 316 10*3/uL (ref 150–400)
RBC: 3.96 MIL/uL (ref 3.87–5.11)
RDW: 12.6 % (ref 11.5–15.5)
WBC: 9.3 10*3/uL (ref 4.0–10.5)
nRBC: 0 % (ref 0.0–0.2)

## 2019-09-29 LAB — LIPASE, BLOOD: Lipase: 22 U/L (ref 11–51)

## 2019-09-29 LAB — TROPONIN I (HIGH SENSITIVITY): Troponin I (High Sensitivity): <2 ng/L (ref <18)

## 2019-09-29 MED ORDER — ONDANSETRON HCL 4 MG/2ML IJ SOLN
4.0000 mg | Freq: Once | INTRAMUSCULAR | Status: AC
  Administered 2019-09-29: 4 mg via INTRAVENOUS
  Filled 2019-09-29: qty 2

## 2019-09-29 MED ORDER — ONDANSETRON 4 MG PO TBDP: 4.0000 mg | ORAL_TABLET | Freq: Three times a day (TID) | ORAL | 0 refills | Status: DC | PRN

## 2019-09-29 MED ORDER — FENTANYL CITRATE (PF) 100 MCG/2ML IJ SOLN
50.0000 ug | Freq: Once | INTRAMUSCULAR | Status: AC
  Administered 2019-09-29: 50 ug via INTRAVENOUS
  Filled 2019-09-29: qty 2

## 2019-09-29 MED ORDER — SODIUM CHLORIDE 0.9 % IV BOLUS
1000.0000 mL | Freq: Once | INTRAVENOUS | Status: AC
  Administered 2019-09-29: 1000 mL via INTRAVENOUS

## 2019-09-29 MED ORDER — PANTOPRAZOLE SODIUM 40 MG IV SOLR: 40.0000 mg | Freq: Once | INTRAVENOUS | Status: DC

## 2019-09-29 NOTE — ED Provider Notes (Signed)
[facility] EMERGENCY DEPARTMENT Provider Note   CSN: [id number] Arrival date & time: 09/29/19  1521     History Chief Complaint  Patient presents with  . Abdominal Pain  . Emesis    Mary Powers is a 44 y.o. female.  Presenting to the emergency room with chief complaint of abdominal pain.  Patient reports that she has been struggling with intermittent episodes of abdominal pain over the past few months, however no one has been able to provide definitive answers as to pain explanation.  Occasional episode of vomiting, nonbloody nonbilious.  Pain is currently moderate to severe, burning, sharp sensation primarily in her upper abdomen.  Does seem to radiate up her chest at times.  Currently not doing this.  Language Customer service manager utilized.  Endoscopy on Thursday, reviewed report, everything was essentially normal.  Patient tolerated procedure well, there were no complications.   HPI     Past Medical History:  Diagnosis Date  . Anemia   . Anxiety   . Depressive disorder   . Fibromyalgia   . GAD (generalized anxiety disorder)   . Gastritis   . GERD (gastroesophageal reflux disease)   . History of Helicobacter pylori infection   . Hyperlipidemia   . Sleep apnea     There are no problems to display for this patient.   Past Surgical History:  Procedure Laterality Date  . ESOPHAGOGASTRODUODENOSCOPY  09/11/2015   [facility] [place]     OB History   No obstetric history on file.     Family History  Problem Relation Age of Onset  . Depression Mother   . Heart disease Mother   . Colon cancer Neg Hx   . Esophageal cancer Neg Hx     Social History   Tobacco Use  . Smoking status: Never Smoker  . Smokeless tobacco: Never Used  Substance Use Topics  . Alcohol use: No  . Drug use: No    Home Medications Prior to Admission medications   Medication Sig Start Date End Date Taking? Authorizing Provider  hydrOXYzine  (ATARAX/VISTARIL) 25 MG tablet Take 1 tablet (25 mg total) by mouth every 8 (eight) hours as needed for anxiety. Patient not taking: Reported on 09/16/2019 06/06/19   Linwood Dibbles, MD  ibuprofen (ADVIL,MOTRIN) 600 MG tablet Take 1 tablet (600 mg total) by mouth every 6 (six) hours as needed. Patient not taking: Reported on 09/16/2019 06/08/15   Joycie Peek, PA-C  meclizine (ANTIVERT) 25 MG tablet Take 1 tablet (25 mg total) by mouth 3 (three) times daily as needed for dizziness. Patient not taking: Reported on 09/16/2019 08/23/15   Everlene Farrier, PA-C  omeprazole (PRILOSEC) 20 MG capsule Take by mouth.    [provider]  ondansetron (ZOFRAN ODT) 4 MG disintegrating tablet Take 1 tablet (4 mg total) by mouth every 8 (eight) hours as needed for nausea or vomiting. 09/29/19   Milagros Loll, MD  ondansetron (ZOFRAN) 4 MG tablet Take by mouth.    [provider]  pantoprazole (PROTONIX) 40 MG tablet Take 1 tablet (40 mg total) by mouth 2 (two) times daily. 09/16/19   Cirigliano, Vito V, DO  metoCLOPramide (REGLAN) 10 MG tablet Take by mouth. 06/04/19 06/06/19  [provider]    Allergies    Lactose intolerance (gi), Other, and Zoloft [sertraline]  Review of Systems   Review of Systems  Constitutional: Negative for chills and fever.  HENT: Negative for ear pain and sore throat.  Eyes: Negative for pain and visual disturbance.  Respiratory: Negative for cough and shortness of breath.   Cardiovascular: Positive for chest pain. Negative for palpitations.  Gastrointestinal: Positive for abdominal pain. Negative for vomiting.  Genitourinary: Negative for dysuria and hematuria.  Musculoskeletal: Negative for arthralgias and back pain.  Skin: Negative for color change and rash.  Neurological: Negative for seizures and syncope.  All other systems reviewed and are negative.   Physical Exam Updated Vital Signs BP 121/67   Pulse 90   Temp 99.2 F (37.3 C) (Oral)   Resp  18   Ht 5' (1.524 m)   Wt 44.9 kg   SpO2 100%   BMI 19.33 kg/m   Physical Exam Vitals and nursing note reviewed.  Constitutional:      General: She is not in acute distress.    Appearance: She is well-developed.  HENT:     Head: Normocephalic and atraumatic.  Eyes:     Conjunctiva/sclera: Conjunctivae normal.  Cardiovascular:     Rate and Rhythm: Normal rate and regular rhythm.     Heart sounds: No murmur.  Pulmonary:     Effort: Pulmonary effort is normal. No respiratory distress.     Breath sounds: Normal breath sounds.  Abdominal:     Palpations: Abdomen is soft.     Comments: Abdomen is quite soft, there is some tenderness in her epigastric region, no right upper quadrant pain, negative Murphy sign, normal bowel sounds  Musculoskeletal:     Cervical back: Neck supple.  Skin:    General: Skin is warm and dry.     Capillary Refill: Capillary refill takes less than 2 seconds.  Neurological:     Mental Status: She is alert.     ED Results / Procedures / Treatments   Labs (all labs ordered are listed, but only abnormal results are displayed) Labs Reviewed  CBC WITH DIFFERENTIAL/PLATELET - Abnormal; Notable for the following components:      Result Value   Neutro Abs 7.9 (*)    All other components within normal limits  COMPREHENSIVE METABOLIC PANEL - Abnormal; Notable for the following components:   Glucose, Bld 126 (*)    AST 14 (*)    All other components within normal limits  LIPASE, BLOOD  TROPONIN I (HIGH SENSITIVITY)    EKG EKG Interpretation  Date/Time:  Sunday Sep 29 2019 15:33:48 EDT Ventricular Rate:  125 PR Interval:  138 QRS Duration: 64 QT Interval:  290 QTC Calculation: 418 R Axis:   95 Text Interpretation: Sinus tachycardia Rightward axis Borderline ECG Confirmed by Madalyn Rob [phone]) on 09/29/2019 6:00:00 PM   Radiology DG Chest 2 View  Result Date: 09/29/2019 CLINICAL DATA:  Recent endoscopy with chest pain, initial encounter  EXAM: CHEST - 2 VIEW COMPARISON:  06/18/2019 FINDINGS: The heart size and mediastinal contours are within normal limits. Both lungs are clear. The visualized skeletal structures are unremarkable. IMPRESSION: No active cardiopulmonary disease. Electronically Signed   By: Inez Catalina M.D.   On: 09/29/2019 16:59    Procedures Procedures (including critical care time)  Medications Ordered in ED Medications  ondansetron (ZOFRAN) injection 4 mg (4 mg Intravenous Given 09/29/19 1727)  sodium chloride 0.9 % bolus 1,000 mL ( Intravenous Stopped 09/29/19 1828)  fentaNYL (SUBLIMAZE) injection 50 mcg (50 mcg Intravenous Given 09/29/19 1728)    ED Course  I have reviewed the triage vital signs and the nursing notes.  Pertinent labs & imaging results that were available during my  care of the patient were reviewed by me and considered in my medical decision making (see chart for details).    MDM Rules/Calculators/A&P                      44 year old lady who presented to ER with concern for epigastric abdominal pain.  Patient has had multiple recurrent episodes similar to this today over the past few months.  Extensive work-up that has been performed previously includes ultrasound, endoscopy, labs, all of which have been normal.  Today, team basic labs, CXR, normal LFTs, normal lipase.  Given her benign exam and general well appearance, I have very low suspicion for acute abdominal process.  Recommend the patient follow-up with her gastroenterologist as well as her primary care doctor.  P.o. tolerant without difficulty.  Discharged home.    After the discussed management above, the patient was determined to be safe for discharge.  The patient was in agreement with this plan and all questions regarding their care were answered.  ED return precautions were discussed and the patient will return to the ED with any significant worsening of condition.  Final Clinical Impression(s) / ED Diagnoses Final  diagnoses:  Nausea and vomiting, intractability of vomiting not specified, unspecified vomiting type    Rx / DC Orders ED Discharge Orders         Ordered    ondansetron (ZOFRAN ODT) 4 MG disintegrating tablet  Every 8 hours PRN     09/29/19 1842           Milagros Loll, MD 09/29/19 2347

## 2019-09-29 NOTE — ED Notes (Signed)
Sprite provided for po challenge 

## 2019-09-29 NOTE — Discharge Instructions (Addendum)
Recommend taking Zofran as needed for further nausea.  Please schedule follow-up appointment both with your gastroenterologist as well as your primary care doctor.  If you develop worsening pain, vomiting, return to ER for reassessment.  Future Appointments  Date Time Provider Department Center  10/01/2019 12:45 PM [facility] CL[facility]IC [facility] None

## 2019-09-29 NOTE — ED Triage Notes (Signed)
Pt had Endoscopy on Thursday, COVID test on Tuesday that is negative.  Pt c/o abdominal pain that radiates into chest onset last night.  Pt reports vomited x 2 today.

## 2019-09-30 ENCOUNTER — Telehealth: Payer: Self-pay

## 2019-09-30 NOTE — Telephone Encounter (Signed)
  Follow up Call-  Call back number 09/26/2019  Post procedure Call Back phone  # [phone]  Permission to leave phone message Yes  Some recent data might be hidden     Patient questions:  Do you have a fever, pain , or abdominal swelling? Yes.   Pain Score  4 *  Patient states that she is still having the same pain and nausea that she was having before the procedure. States she went to the ER yesterday and was sent home. Is going to call to get an appointment to be seen in GI clinic. Patient states she will call if pain gets worse or she has any other concerns.   Have you tolerated food without any problems? Yes.    Have you been able to return to your normal activities? Yes.    Do you have any questions about your discharge instructions: Diet   No. Medications  No. Follow up visit  No.  Do you have questions or concerns about your Care? No.  Actions: * If pain score is 4 or above: No action needed, pain <4.    1. Have you developed a fever since your procedure? No  2.   Have you had an respiratory symptoms (SOB or cough) since your procedure? No  3.   Have you tested positive for COVID 19 since your procedure No  4.   Have you had any family members/close contacts diagnosed with the COVID 19 since your procedure?  No   If yes to any of these questions please route to Laverna Peace, RN and Charlett Lango, RN

## 2019-10-01 ENCOUNTER — Ambulatory Visit: Payer: Self-pay | Admitting: *Deleted

## 2019-10-01 ENCOUNTER — Other Ambulatory Visit: Payer: Self-pay

## 2019-10-01 VITALS — BP 118/72 | Temp 97.7°F | Wt 98.7 lb

## 2019-10-01 DIAGNOSIS — Z1239 Encounter for other screening for malignant neoplasm of breast: Secondary | ICD-10-CM

## 2019-10-01 NOTE — Progress Notes (Addendum)
Ms. Rami Barrera Congo is a 44 y.o. female who presents to [facility] clinic today referred from [facility] for a left breast biopsy. Patient had a screening mammogram completed 09/20/2019 that recommended additional imaging of the left breast for calcifications. Left breast diagnostic mammogram completed 5/11/201 that recommended a left breast biopsy. Patient complained of left outer breast since January 2021 that comes and goes. Patient rates the pain at a 3-6 out of 10.   Pap Smear: Pap not smear completed today. Last Pap smear was in November 2020 at the [facility] and was normal per patient. Per patient has no history of an abnormal Pap smear. No Pap smear results are in Epic.   Physical exam: Breasts Right breast larger than left breast that per patient is normal for her. No skin abnormalities bilateral breasts. No nipple retraction bilateral breasts. No nipple discharge bilateral breasts. No lymphadenopathy. No lumps palpated bilateral breasts. Complaints of left upper outer breast pain on exam.       Pelvic/Bimanual Pap is not indicated today per [facility] guidelines.   Smoking History: Patient has never smoked.   Patient Navigation: Patient education provided. Access to services provided for patient through [facility] program. Spanish interpreter Natale Lay from [facility] provided.    Breast and Cervical Cancer Risk Assessment: Patient has no family history of breast cancer, known genetic mutations, or radiation treatment to the chest before age 66. Patient has no history of cervical dysplasia, immunocompromised, or DES exposure in-utero.  Risk Assessment    Risk Scores      10/01/2019   Last edited by: Narda Rutherford, LPN   5-year risk: 0.4 %   Lifetime risk: 6.3 %          A: [facility] exam without pap smear   P: Referred patient to [facility] for a left breast biopsy per recommendation. Appointment scheduled for Monday, Oct 07, 2019 at 0815.  Priscille Heidelberg, RN 10/01/2019 7:59 PM

## 2019-10-01 NOTE — Patient Instructions (Signed)
Explained breast self awareness with Mary Powers. Patient did not need a Pap smear today due to last Pap smear was in November 2020 per patient. Let her know [facility] will cover Pap smears every 3 years unless has a history of abnormal Pap smears. Referred patient to [facility] for a left breast biopsy per recommendation. Appointment scheduled for Monday, Oct 07, 2019 at 0815. Patient aware of appointment and will be there. [name] verbalized understanding.  Quanda Pavlicek, Kathaleen Maser, RN 7:59 PM

## 2019-10-03 ENCOUNTER — Encounter: Payer: Self-pay | Admitting: Gastroenterology

## 2020-07-01 ENCOUNTER — Other Ambulatory Visit: Payer: Self-pay

## 2020-07-01 ENCOUNTER — Emergency Department (HOSPITAL_BASED_OUTPATIENT_CLINIC_OR_DEPARTMENT_OTHER): Payer: Self-pay

## 2020-07-01 ENCOUNTER — Emergency Department (HOSPITAL_BASED_OUTPATIENT_CLINIC_OR_DEPARTMENT_OTHER)
Admission: EM | Admit: 2020-07-01 | Discharge: 2020-07-01 | Disposition: A | Discharge status: Home / Self Care | Payer: Self-pay | Attending: Emergency Medicine | Admitting: Emergency Medicine

## 2020-07-01 ENCOUNTER — Other Ambulatory Visit (HOSPITAL_COMMUNITY): Payer: Self-pay | Admitting: Emergency Medicine

## 2020-07-01 DIAGNOSIS — R072 Precordial pain: Secondary | ICD-10-CM | POA: Insufficient documentation

## 2020-07-01 DIAGNOSIS — R197 Diarrhea, unspecified: Secondary | ICD-10-CM | POA: Insufficient documentation

## 2020-07-01 DIAGNOSIS — R300 Dysuria: Secondary | ICD-10-CM | POA: Insufficient documentation

## 2020-07-01 DIAGNOSIS — R079 Chest pain, unspecified: Secondary | ICD-10-CM

## 2020-07-01 DIAGNOSIS — M549 Dorsalgia, unspecified: Secondary | ICD-10-CM | POA: Insufficient documentation

## 2020-07-01 DIAGNOSIS — R112 Nausea with vomiting, unspecified: Secondary | ICD-10-CM | POA: Insufficient documentation

## 2020-07-01 DIAGNOSIS — R1011 Right upper quadrant pain: Secondary | ICD-10-CM | POA: Insufficient documentation

## 2020-07-01 LAB — COMPREHENSIVE METABOLIC PANEL
ALT: 24 U/L (ref 0–44)
AST: 22 U/L (ref 15–41)
Albumin: 4.6 g/dL (ref 3.5–5.0)
Alkaline Phosphatase: 41 U/L (ref 38–126)
Anion gap: 11 (ref 5–15)
BUN: 12 mg/dL (ref 6–20)
CO2: 23 mmol/L (ref 22–32)
Calcium: 8.8 mg/dL — ABNORMAL LOW (ref 8.9–10.3)
Chloride: 103 mmol/L (ref 98–111)
Creatinine, Ser: 0.69 mg/dL (ref 0.44–1.00)
GFR, Estimated: >60 mL/min (ref >60)
Glucose, Bld: 123 mg/dL — ABNORMAL HIGH (ref 70–99)
Potassium: 3.2 mmol/L — ABNORMAL LOW (ref 3.5–5.1)
Sodium: 137 mmol/L (ref 135–145)
Total Bilirubin: 0.7 mg/dL (ref 0.3–1.2)
Total Protein: 7.9 g/dL (ref 6.5–8.1)

## 2020-07-01 LAB — URINALYSIS, ROUTINE W REFLEX MICROSCOPIC
Bilirubin Urine: NEGATIVE
Glucose, UA: NEGATIVE mg/dL
Ketones, ur: 5 mg/dL — AB
Nitrite: NEGATIVE
Protein, ur: NEGATIVE mg/dL
Specific Gravity, Urine: 1.025 (ref 1.005–1.030)
pH: 6 (ref 5.0–8.0)

## 2020-07-01 LAB — CBC
HCT: 37.9 % (ref 36.0–46.0)
Hemoglobin: 12.6 g/dL (ref 12.0–15.0)
MCH: 31.8 pg (ref 26.0–34.0)
MCHC: 33.2 g/dL (ref 30.0–36.0)
MCV: 95.7 fL (ref 80.0–100.0)
Platelets: 300 10*3/uL (ref 150–400)
RBC: 3.96 MIL/uL (ref 3.87–5.11)
RDW: 12.5 % (ref 11.5–15.5)
WBC: 5.6 10*3/uL (ref 4.0–10.5)
nRBC: 0 % (ref 0.0–0.2)

## 2020-07-01 LAB — URINALYSIS, MICROSCOPIC (REFLEX)

## 2020-07-01 LAB — TROPONIN I (HIGH SENSITIVITY)
Troponin I (High Sensitivity): <2 ng/L (ref <18)
Troponin I (High Sensitivity): <2 ng/L (ref <18)

## 2020-07-01 LAB — PREGNANCY, URINE: Preg Test, Ur: NEGATIVE

## 2020-07-01 LAB — LIPASE, BLOOD: Lipase: 25 U/L (ref 11–51)

## 2020-07-01 MED ORDER — FAMOTIDINE IN NACL 20-0.9 MG/50ML-% IV SOLN
20.0000 mg | Freq: Once | INTRAVENOUS | Status: AC
  Administered 2020-07-01: 20 mg via INTRAVENOUS
  Filled 2020-07-01: qty 50

## 2020-07-01 MED ORDER — FAMOTIDINE 20 MG PO TABS
20.0000 mg | ORAL_TABLET | Freq: Every day | ORAL | 0 refills | Status: DC
Start: 2020-07-01 — End: 2020-08-19

## 2020-07-01 MED ORDER — ONDANSETRON HCL 4 MG/2ML IJ SOLN
4.0000 mg | Freq: Once | INTRAMUSCULAR | Status: DC
  Filled 2020-07-01: qty 2

## 2020-07-01 MED ORDER — SODIUM CHLORIDE 0.9 % IV BOLUS
1000.0000 mL | Freq: Once | INTRAVENOUS | Status: AC
  Administered 2020-07-01: 1000 mL via INTRAVENOUS

## 2020-07-01 MED ORDER — MORPHINE SULFATE (PF) 4 MG/ML IV SOLN
4.0000 mg | Freq: Once | INTRAVENOUS | Status: DC
  Filled 2020-07-01: qty 1

## 2020-07-01 MED FILL — FAMOTIDINE 20 MG TABLET: 20 | 30 days supply | Qty: 30 | Fill #0

## 2020-07-01 NOTE — ED Triage Notes (Signed)
Pt from home r/t to chest pain radiating to the right side. Pt states that's it not really pain but a pressure with ocasional burning

## 2020-07-01 NOTE — ED Notes (Signed)
Patient transported to X-ray 

## 2020-07-01 NOTE — Discharge Instructions (Signed)
You were seen in the emergency department for upper abdominal pain and chest pain.  You had blood work EKG and an ultrasound that did not show any serious findings.  We are putting you on some acid medication.  We are also putting a referral back in for gastroenterology.  Return to the emergency department if any worsening or concerning symptoms

## 2020-07-01 NOTE — ED Notes (Signed)
IV Pump Medications Have Completed

## 2020-07-01 NOTE — ED Provider Notes (Signed)
[facility] EMERGENCY DEPARTMENT Provider Note   CSN: [id number] Arrival date & time: 07/01/20  0801     History Chief Complaint  Patient presents with  . Chest Pain    Mary Powers is a 45 y.o. female.  She is Spanish-speaking and an iPad interpreter was used for the interview.  Complaining of some chest pain radiating to her back that started 4 days ago.  7 out of 10 intensity waxing and waning.  Associated with nausea and vomiting.  No blood.  1 episode of diarrhea.  Has a little bit of dysuria.  No hematuria noted.  States she has had this pain a year ago and saw GI for it with no clear diagnosis.  She is not COVID vaccinated.  No cough or shortness of breath.  The history is provided by the patient.  Chest Pain Pain location:  Substernal area Pain quality: burning   Pain radiates to:  Mid back Pain severity:  Severe Onset quality:  Gradual Duration:  4 days Timing:  Intermittent Progression:  Waxing and waning Chronicity:  Recurrent Relieved by:  Nothing Worsened by:  Nothing Ineffective treatments:  None tried Associated symptoms: abdominal pain (subxiphoid), back pain, nausea and vomiting   Associated symptoms: no cough, no fever, no headache and no shortness of breath        Past Medical History:  Diagnosis Date  . Anemia   . Anxiety   . Depressive disorder   . Fibromyalgia   . GAD (generalized anxiety disorder)   . Gastritis   . GERD (gastroesophageal reflux disease)   . History of Helicobacter pylori infection   . Hyperlipidemia   . Sleep apnea     There are no problems to display for this patient.   Past Surgical History:  Procedure Laterality Date  . ESOPHAGOGASTRODUODENOSCOPY  09/11/2015   [facility] [place]     OB History   No obstetric history on file.     Family History  Problem Relation Age of Onset  . Heart disease Mother   . Hypertension Mother   . Colon cancer Neg Hx   . Esophageal cancer  Neg Hx     Social History   Tobacco Use  . Smoking status: Never Smoker  . Smokeless tobacco: Never Used  Vaping Use  . Vaping Use: Never used  Substance Use Topics  . Alcohol use: No  . Drug use: No    Home Medications Prior to Admission medications   Medication Sig Start Date End Date Taking? Authorizing Provider  metoCLOPramide (REGLAN) 10 MG tablet Take by mouth. 06/04/19 06/06/19  [provider]    Allergies    Lactose intolerance (gi), Other, and Zoloft [sertraline]  Review of Systems   Review of Systems  Constitutional: Negative for fever.  HENT: Negative for sore throat.   Eyes: Negative for visual disturbance.  Respiratory: Negative for cough and shortness of breath.   Cardiovascular: Positive for chest pain.  Gastrointestinal: Positive for abdominal pain (subxiphoid), nausea and vomiting.  Genitourinary: Negative for dysuria.  Musculoskeletal: Positive for back pain.  Skin: Negative for rash.  Neurological: Negative for headaches.    Physical Exam Updated Vital Signs BP 123/77 (BP Location: Right Arm)   Pulse (!) 105   Temp 97.9 F (36.6 C) (Oral)   Resp 14   Ht 5' (1.524 m)   Wt 46.7 kg   LMP 06/28/2020 (Exact Date)   SpO2 100%   BMI 20.12 kg/m  Physical Exam Vitals and nursing note reviewed.  Constitutional:      General: She is not in acute distress.    Appearance: She is well-developed and well-nourished.  HENT:     Head: Normocephalic and atraumatic.     Mouth/Throat:     Mouth: Oropharynx is clear and moist.  Eyes:     Conjunctiva/sclera: Conjunctivae normal.  Cardiovascular:     Rate and Rhythm: Normal rate and regular rhythm.     Pulses: Intact distal pulses.     Heart sounds: Normal heart sounds. No murmur heard.   Pulmonary:     Effort: Pulmonary effort is normal. No respiratory distress.     Breath sounds: Normal breath sounds. No stridor. No wheezing.  Abdominal:     Palpations: Abdomen is soft.     Tenderness:  There is no abdominal tenderness. There is no guarding or rebound.  Musculoskeletal:        General: No tenderness or edema. Normal range of motion.     Cervical back: Neck supple.  Skin:    General: Skin is warm and dry.     Capillary Refill: Capillary refill takes less than 2 seconds.  Neurological:     General: No focal deficit present.     Mental Status: She is alert.     GCS: GCS eye subscore is 4. GCS verbal subscore is 5. GCS motor subscore is 6.  Psychiatric:        Mood and Affect: Mood and affect normal.     ED Results / Procedures / Treatments   Labs (all labs ordered are listed, but only abnormal results are displayed) Labs Reviewed  COMPREHENSIVE METABOLIC PANEL - Abnormal; Notable for the following components:      Result Value   Potassium 3.2 (*)    Glucose, Bld 123 (*)    Calcium 8.8 (*)    All other components within normal limits  URINALYSIS, ROUTINE W REFLEX MICROSCOPIC - Abnormal; Notable for the following components:   Hgb urine dipstick MODERATE (*)    Ketones, ur 5 (*)    Leukocytes,Ua SMALL (*)    All other components within normal limits  URINALYSIS, MICROSCOPIC (REFLEX) - Abnormal; Notable for the following components:   Bacteria, UA MANY (*)    All other components within normal limits  CBC  PREGNANCY, URINE  LIPASE, BLOOD  TROPONIN I (HIGH SENSITIVITY)  TROPONIN I (HIGH SENSITIVITY)    EKG EKG Interpretation  Date/Time:  Wednesday July 01 2020 08:13:24 EST Ventricular Rate:  111 PR Interval:    QRS Duration: 79 QT Interval:  324 QTC Calculation: 441 R Axis:   73 Text Interpretation: Sinus tachycardia No significant change since prior 5/21 Confirmed by Meridee Score [id number]) on 07/01/2020 8:23:07 AM   Radiology DG Chest 2 View  Result Date: 07/01/2020 CLINICAL DATA:  Chest pain EXAM: CHEST - 2 VIEW COMPARISON:  Sep 29, 2019 FINDINGS: Lungs are clear. The heart size and pulmonary vascularity are normal. No adenopathy. No  pneumothorax. No bone lesions. Hypoplastic right first rib, an anatomic variant. IMPRESSION: Lungs clear.  Heart size normal. Electronically Signed   By: Bretta Bang III M.D.   On: 07/01/2020 08:49   US Abdomen Limited RUQ (LIVER/GB)  Result Date: 07/01/2020 CLINICAL DATA:  Right upper quadrant pain EXAM: ULTRASOUND ABDOMEN LIMITED RIGHT UPPER QUADRANT COMPARISON:  None. FINDINGS: Gallbladder: No gallstones or wall thickening visualized. There is no pericholecystic fluid. No sonographic Murphy sign noted by sonographer. Common bile duct:  Diameter: 4 mm. No intrahepatic or extrahepatic biliary duct dilatation. Liver: No focal lesion identified. Within normal limits in parenchymal echogenicity. Portal vein is patent on color Doppler imaging with normal direction of blood flow towards the liver. Other: None. IMPRESSION: Study within normal limits. Electronically Signed   By: Bretta Bang III M.D.   On: 07/01/2020 12:21    Procedures Procedures   Medications Ordered in ED Medications  morphine 4 MG/ML injection 4 mg (has no administration in time range)  ondansetron (ZOFRAN) injection 4 mg (has no administration in time range)  sodium chloride 0.9 % bolus 1,000 mL (has no administration in time range)    ED Course  I have reviewed the triage vital signs and the nursing notes.  Pertinent labs & imaging results that were available during my care of the patient were reviewed by me and considered in my medical decision making (see chart for details).  Clinical Course as of 07/01/20 1910  Wed Jul 01, 2020  1219 Called radiology as the ultrasound has not been read yet.  They are going to try to get it to a radiologist. [MB]  1221 Reassessed patient.  She says her pain is improved but she just feels her heart beating a little fast.  She is going around 100 on the monitor. [MB]    Clinical Course User Index [MB] Terrilee Files, MD   MDM Rules/Calculators/A&P                          Alfredia Ferguson Barrera Powers was evaluated in Emergency Department on 07/01/2020 for the symptoms described in the history of present illness. She was evaluated in the context of the global COVID-19 pandemic, which necessitated consideration that the patient might be at risk for infection with the SARS-CoV-2 virus that causes COVID-19. Institutional protocols and algorithms that pertain to the evaluation of patients at risk for COVID-19 are in a state of rapid change based on information released by regulatory bodies including the CDC and federal and state organizations. These policies and algorithms were followed during the patient's care in the ED.  This patient complains of chest pain and upper abdominal pain; this involves an extensive number of treatment Options and is a complaint that carries with it a high risk of complications and Morbidity. The differential includes ACS, pneumonia, PE, vascular, cholelithiasis, cholecystitis, GERD, reflux, peptic ulcer disease, perforation  I ordered, reviewed and interpreted labs, which included CBC with normal white count normal hemoglobin, chemistries normal other than mildly low potassium, LFTs normal, urinalysis not consistent with infection, pregnancy test negative, troponins flat I ordered medication IV pain medicine fluids nausea medication and acid medication I ordered imaging studies which included right upper quadrant ultrasound and chest x-ray and I independently    visualized and interpreted imaging which showed no acute disease Additional history obtained from patient's daughter Previous records obtained and reviewed in epic including prior GI visit  After the interventions stated above, I reevaluated the patient and found patient symptoms to be improved.  She is currently not on any kind acid medication.  Will start on Pepcid and refer back to GI.  Return instructions discussed   Final Clinical Impression(s) / ED Diagnoses Final diagnoses:   RUQ abdominal pain  Nonspecific chest pain    Rx / DC Orders ED Discharge Orders         Ordered    famotidine (PEPCID) 20 MG tablet  Daily  07/01/20 1225    Ambulatory referral to Gastroenterology        07/01/20 1226           Terrilee FilesButler, Lavonna Lampron C, MD 07/01/20 [phone]

## 2020-08-19 ENCOUNTER — Encounter: Payer: Self-pay | Admitting: Gastroenterology

## 2020-08-19 ENCOUNTER — Other Ambulatory Visit: Payer: Self-pay

## 2020-08-19 ENCOUNTER — Ambulatory Visit (INDEPENDENT_AMBULATORY_CARE_PROVIDER_SITE_OTHER): Payer: Self-pay | Admitting: Gastroenterology

## 2020-08-19 VITALS — BP 94/68 | HR 61 | Ht 60.0 in | Wt 102.0 lb

## 2020-08-19 DIAGNOSIS — R112 Nausea with vomiting, unspecified: Secondary | ICD-10-CM

## 2020-08-19 DIAGNOSIS — R1013 Epigastric pain: Secondary | ICD-10-CM

## 2020-08-19 MED ORDER — DICYCLOMINE HCL 10 MG PO CAPS: 10.0000 mg | ORAL_CAPSULE | Freq: Four times a day (QID) | ORAL | 0 refills | Status: AC | PRN

## 2020-08-19 NOTE — Progress Notes (Signed)
P  Chief Complaint: Nausea, epigastric pain  GI History: 44 history of fibromyalgia, GAD, sleep apnea, initially seen in GI clinic in 09/2019 for evaluation of following:  1) Epigastric pain: Index symptoms of MEG pain/dyspepsia, nausea with nonbloody emesis, with occasional heartburn, regurgitation, postprandial cough.  Symptoms have been present for 10+ years.  Symptoms tend to be postprandial with occasional foamy regurgitant.  No change with trial of omeprazole.  Started losing weight in 2021 due to fear of eating/fear of exacerbating symptoms. -EGD (2016, [place]) normal per patient -06/17/2015: Reports history of H. pylori treated with clarithromycin, Amoxil, PPI x14 days -05/2019: [place] [facility] evaluation.  Normal cardiac status -05/2019: Start Protonix 40 mg/day and sucralfate without in symptoms -06/2019: ER evaluation for chest pain.  Normal CBC, CMP, lipase, lactate, troponin, D-dimer, coag panel, CXR -09/2019: EGD: Normal.  Empiric dilation with [address] [place].  Normal esophageal biopsies.  Normal stomach (benign) and duodenum -09/29/2019: ER evaluation for chest/epigastric pain.  Normal labs, CXR.  2) Dysphagia: Intermittent dysphagia to solids and liquids, pointing to suprasternal notch.  No history of food impactions.  Has had to regurgitate food back out.  Resolved with EGD with empiric dilation.  HPI:     Patient is a 45 y.o. female presenting to the Gastroenterology Clinic for follow-up.  Initially seen by me on 09/16/2019 with history as outlined below.  Trialed course of high-dose PPI of Protonix 40 mg bid with minimal improvement.  EGD was essentially unrevealing.  No follow-up since then.  Repeat ER evaluation of 07/01/2020 for chest pain.  Normal CBC, CMP (K3.2), lipase, troponin.  RUQ [place] normal.  CXR normal.  Was started on Pepcid and advised to follow-up in GI clinic.  Today, states she continues to have nausea with episodic nonbloody emesis and  postprandial epigastric pain. Sxs have essentially been present since last appt. Sxs occur near daily, worse in the AM, but can also go a couple days without symptoms. No improvement with Pepcid, which she has since stopped. Wt stable over the last year. Can have RUQ pain, but less pronounced.   Dysphagia resolved after previous EGD with dilation.  No longer having heartburn/regurgitation symptoms.  History obtained via interpreter in room today.  Review of systems:     No chest pain, no SOB, no fevers, no urinary sx   Past Medical History:  Diagnosis Date  . Anemia   . Anxiety   . Depressive disorder   . Fibromyalgia   . GAD (generalized anxiety disorder)   . Gastritis   . GERD (gastroesophageal reflux disease)   . History of Helicobacter pylori infection   . Hyperlipidemia   . Sleep apnea     Patient's surgical history, family medical history, social history, medications and allergies were all reviewed in Epic    Current Outpatient Medications  Medication Sig Dispense Refill  . famotidine (PEPCID) 20 MG tablet TAKE 1 TABLET (20 MG TOTAL) BY MOUTH DAILY. (Patient not taking: Reported on 08/19/2020) 30 tablet 0   No current facility-administered medications for this visit.    Physical Exam:     BP 94/68   Pulse 61   Ht 5' (1.524 m)   Wt 102 lb (46.3 kg)   BMI 19.92 kg/m   GENERAL:  Pleasant female in NAD PSYCH: : Cooperative, normal affect EENT:  conjunctiva pink, mucous membranes moist, neck supple without masses CARDIAC:  RRR, no murmur heard, no peripheral edema PULM: Normal respiratory effort, lungs CTA bilaterally,  no wheezing ABDOMEN: Minimal TTP upper abdomen without rebound or guarding.  No peritoneal signs.  Nondistended, soft. No obvious masses, no hepatomegaly,  normal bowel sounds SKIN:  turgor, no lesions seen Musculoskeletal:  Normal muscle tone, normal strength NEURO: Alert and oriented x 3, no focal neurologic deficits   IMPRESSION and PLAN:    1)  Nonulcer dyspepsia/Epigastric pain 2) Nausea with intermittent emesis  -CT abdomen/pelvis to evaluate for extraluminal or vascular pathology -Trial FD Guard -Trial Bentyl 10 mg prn Q6H prn breakthrough symptoms -No longer taking acid suppression therapy.  Do not need to resume given lack of efficacy -Has otherwise had a fairly extensive, but unrevealing work-up.  Depending on results above and response to therapy, could also benefit from course of SNRI or TCA  I spent 30 minutes of time, including in depth chart review, independent review of results as outlined above, communicating results with the patient directly, face-to-face time with the patient, coordinating care, and ordering studies and medications as appropriate, and documentation.            Verlin Dike Jahleel Stroschein ,DO, FACG 08/19/2020, 2:04 PM

## 2020-08-19 NOTE — Patient Instructions (Addendum)
If you are age 43 or older, your body mass index should be between 23-30. Your Body mass index is 19.92 kg/m. If this is out of the aforementioned range listed, please consider follow up with your Primary Care Provider.  If you are age 70 or younger, your body mass index should be between 19-25. Your Body mass index is 19.92 kg/m. If this is out of the aformentioned range listed, please consider follow up with your Primary Care Provider.   We have sent the following medications to your pharmacy for you to pick up at your convenience:  Bentyl 10 mg 1 tablet every 6 hours as needed  FD Guard samples given 6 boxes- LOT [id number] exp 05/2021   You are scheduled on 08/21/2020 at 9:30am. You should arrive 15 minutes prior to your appointment time for registration. Please follow the written instructions below on the day of your exam:  WARNING: IF YOU ARE ALLERGIC TO IODINE/X-RAY DYE, PLEASE NOTIFY RADIOLOGY IMMEDIATELY AT [phone]! YOU WILL BE GIVEN A 13 HOUR PREMEDICATION PREP.  1) Do not eat or drink anything after 5:30am (4 hours prior to your test) 2) You have been given 2 bottles of oral contrast to drink. The solution may taste better if refrigerated, but do NOT add ice or any other liquid to this solution. Shake well before drinking.    Drink 1 bottle of contrast @ 7:30am (2 hours prior to your exam)  Drink 1 bottle of contrast @ 8:30am (1 hour prior to your exam)  You may take any medications as prescribed with a small amount of water, if necessary. If you take any of the following medications: METFORMIN, GLUCOPHAGE, GLUCOVANCE, AVANDAMET, RIOMET, FORTAMET, [place] MET, JANUMET, GLUMETZA or METAGLIP, you MAY be asked to HOLD this medication 48 hours AFTER the exam.  The purpose of you drinking the oral contrast is to aid in the visualization of your intestinal tract. The contrast solution may cause some diarrhea. Depending on your individual set of symptoms, you may also receive an  intravenous injection of x-ray contrast/dye. Plan on being at [facility] for 30 minutes or longer, depending on the type of exam you are having performed.  This test typically takes 30-45 minutes to complete.  Due to recent changes in healthcare laws, you may see the results of your imaging and laboratory studies on MyChart before your provider has had a chance to review them.  We understand that in some cases there may be results that are confusing or concerning to you. Not all laboratory results come back in the same time frame and the provider may be waiting for multiple results in order to interpret others.  Please give [place] 48 hours in order for your provider to thoroughly review all the results before contacting the office for clarification of your results.   Thank you for choosing me and [facility].  Vito Cirigliano, D.O.

## 2020-08-21 ENCOUNTER — Other Ambulatory Visit: Payer: Self-pay

## 2020-08-21 ENCOUNTER — Encounter (HOSPITAL_BASED_OUTPATIENT_CLINIC_OR_DEPARTMENT_OTHER): Payer: Self-pay

## 2020-08-21 ENCOUNTER — Ambulatory Visit (HOSPITAL_BASED_OUTPATIENT_CLINIC_OR_DEPARTMENT_OTHER)
Admission: RE | Admit: 2020-08-21 | Discharge: 2020-08-21 | Disposition: A | Discharge status: Home / Self Care | Payer: Self-pay | Source: Ambulatory Visit | Attending: Gastroenterology | Admitting: Gastroenterology

## 2020-08-21 DIAGNOSIS — R1013 Epigastric pain: Secondary | ICD-10-CM | POA: Insufficient documentation

## 2020-08-21 MED ORDER — IOHEXOL 300 MG/ML  SOLN
100.0000 mL | Freq: Once | INTRAMUSCULAR | Status: AC | PRN
  Administered 2020-08-21: 100 mL via INTRAVENOUS

## 2020-08-24 ENCOUNTER — Telehealth: Payer: Self-pay | Admitting: Gastroenterology

## 2020-08-24 NOTE — Telephone Encounter (Signed)
Left msg on pt's vm to call back regarding her CT scan results.

## 2020-08-24 NOTE — Telephone Encounter (Signed)
Spoke with pt's spouse and informed him of CT scan results, he will inform pt

## 2020-10-13 ENCOUNTER — Telehealth: Payer: Self-pay

## 2020-10-13 NOTE — Telephone Encounter (Signed)
Patient called stating that she received a bill from [facility] for mammo that was done on 09/24/20. Patient was due for 1 year follow-up after 10/28/2019. Patient stated that someone from [facility] called her to schedule appointment. Explained to patient that [facility] would not be able to pay for mammogram because it has not been a full year and a day since last imaging was done. Instructed patient to call [facility] to ask them why she was called and scheduled early before imaging was due. Patient voiced understanding.

## 2021-06-16 IMAGING — CR DG CHEST 2V
2 series · 2 of 2 positions shown · non-contrast
Comparison: 06/18/2019

CLINICAL DATA: Recent endoscopy with chest pain, initial encounter

EXAM:
CHEST - 2 VIEW

[w chest pa]
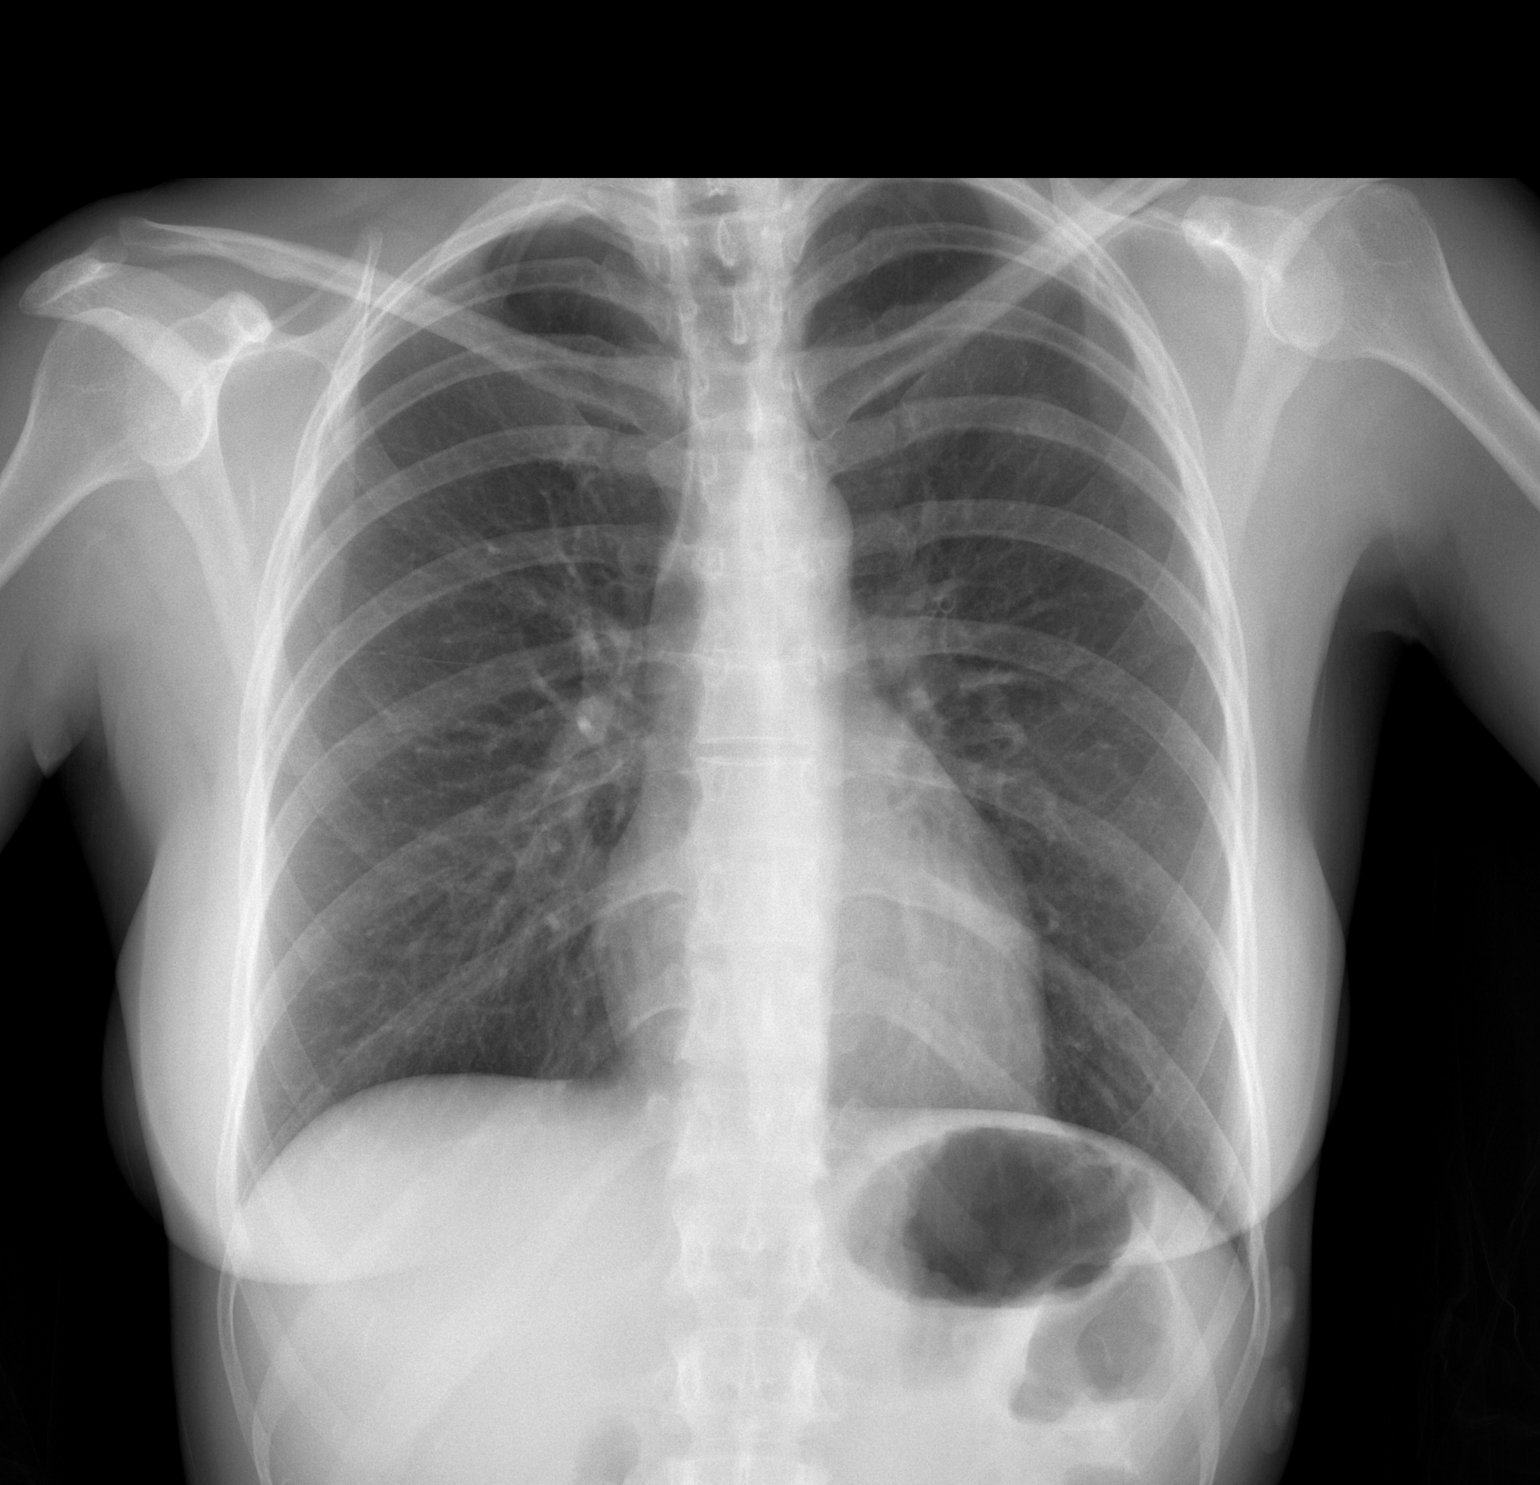

[w chest lat]
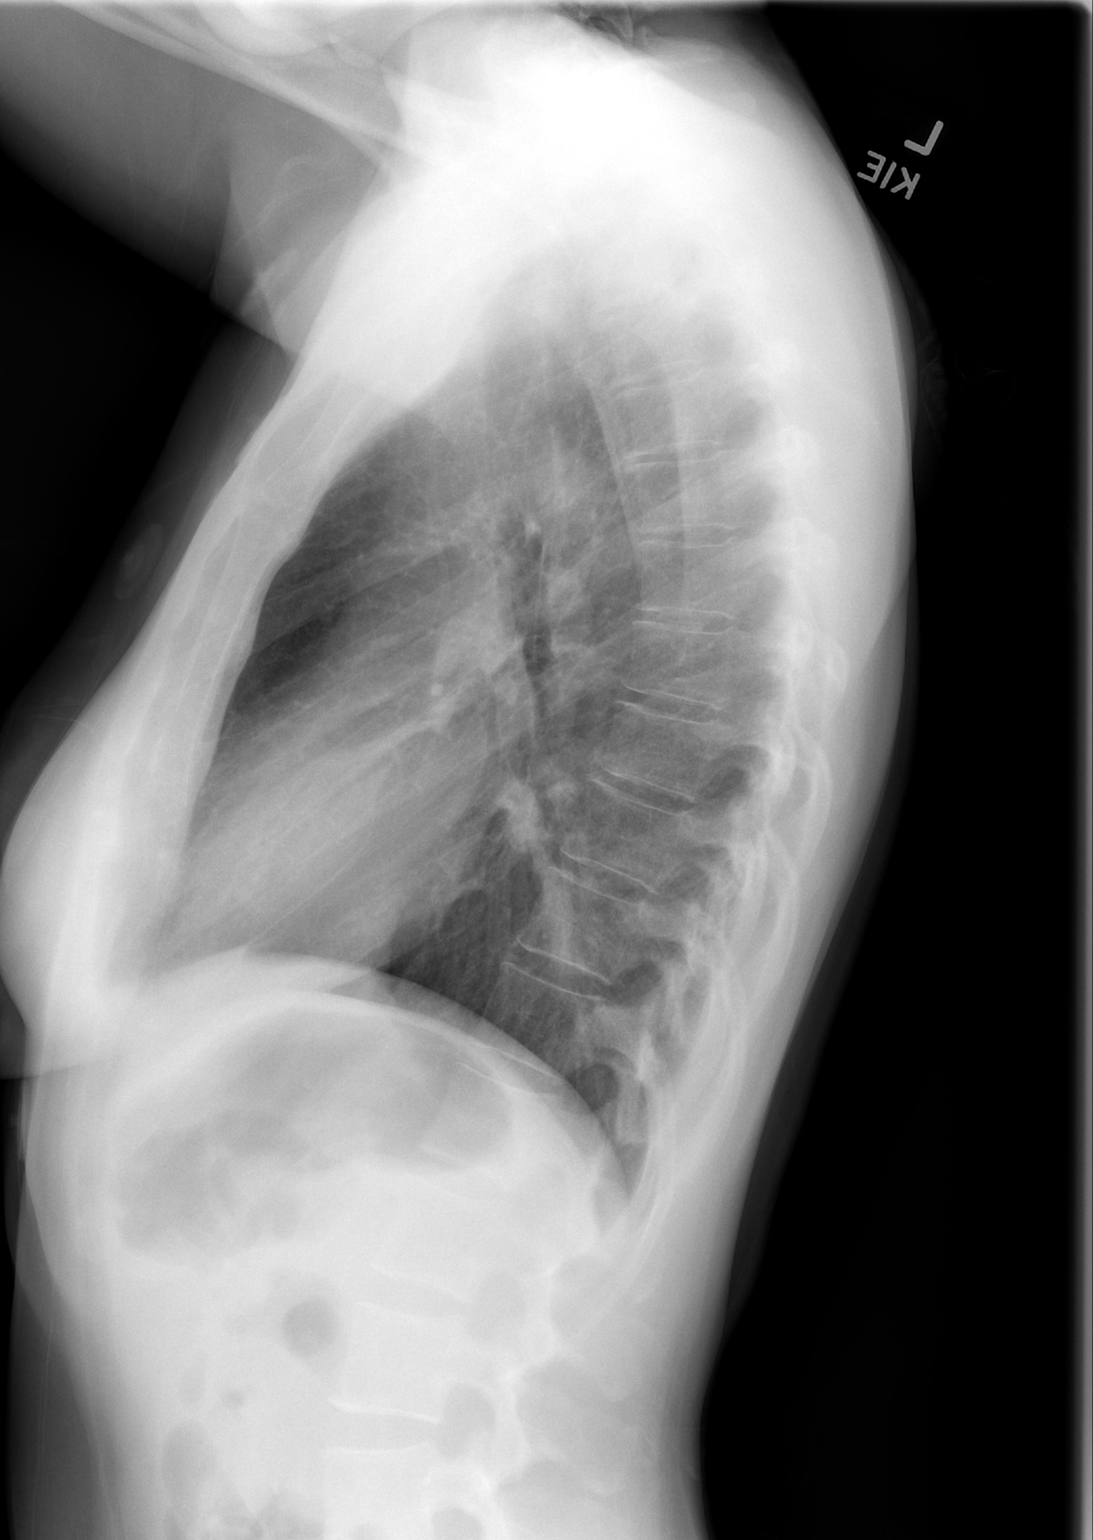

[2 of 2 positions shown; findings below may reference images not displayed]

FINDINGS: The heart size and mediastinal contours are within normal limits.
Both lungs are clear. The visualized skeletal structures are
unremarkable.
IMPRESSION: No active cardiopulmonary disease.

## 2022-05-09 IMAGING — CT CT ABD-PELV W/ CM
2 of 5 series · 16 of 46 positions shown, 18 images · IV contrast (Omnipaque)
Comparison: None.

CLINICAL DATA: One year of epigastric and right-sided abdominal
pain, nausea, vomiting, diarrhea, constipation, weight loss

EXAM:
CT ABDOMEN AND PELVIS WITH CONTRAST
TECHNIQUE: Multidetector CT imaging of the abdomen and pelvis was performed
using the standard protocol following bolus administration of
intravenous contrast.
CONTRAST:  100mL OMNIPAQUE IOHEXOL 300 MG/ML SOLN, additional oral
enteric contrast

[Series 2: axial st · axial · 0.78mm/px · z∈[-430,-50]mm · 13 of 86 slices shown, 15 images]
[im 5/86  soft-tissue]
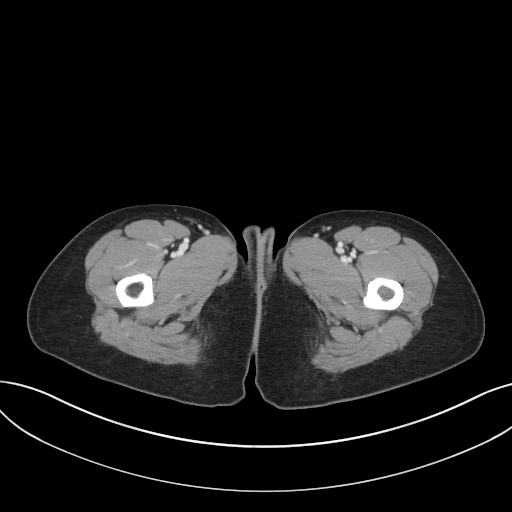
[im 5/86  bone]
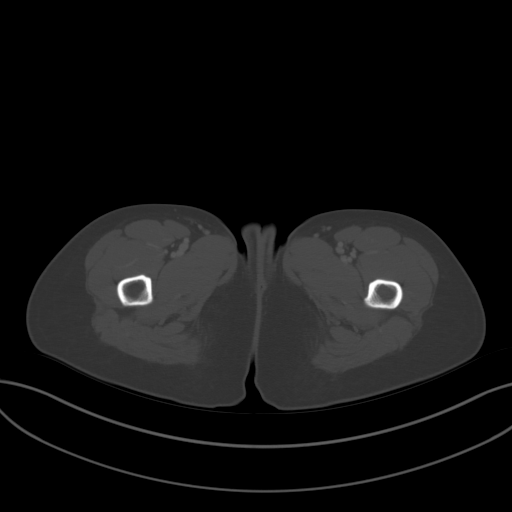
[im 14/86  soft-tissue]
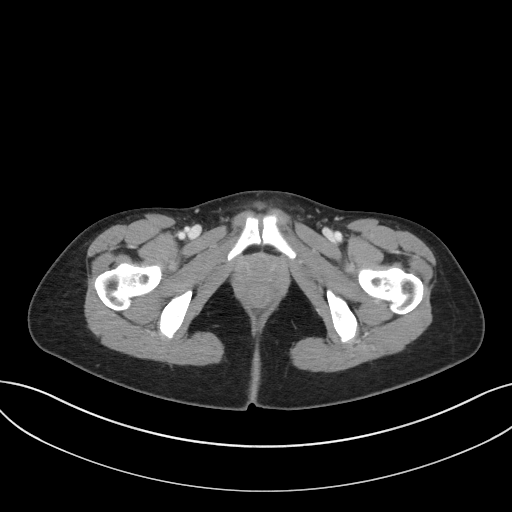
[im 18/86  soft-tissue]
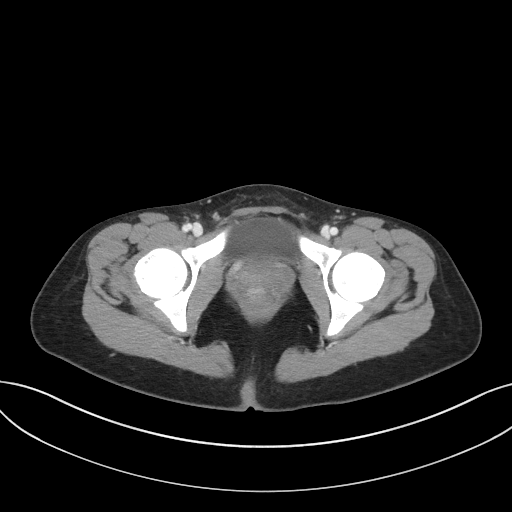
[im 23/86  soft-tissue]
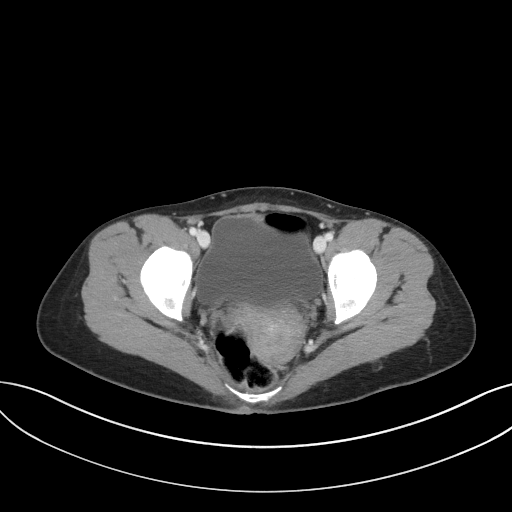
[im 32/86  soft-tissue]
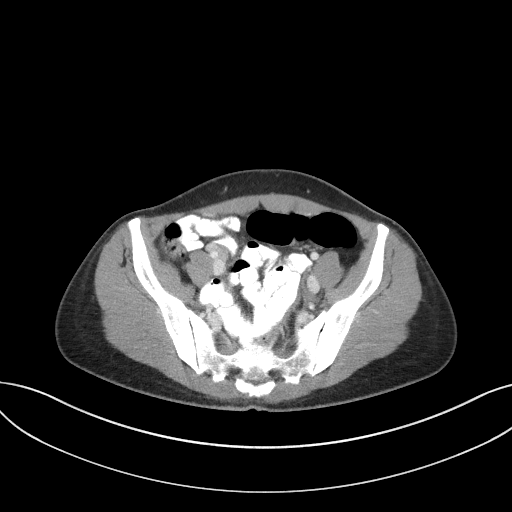
[im 36/86  soft-tissue]
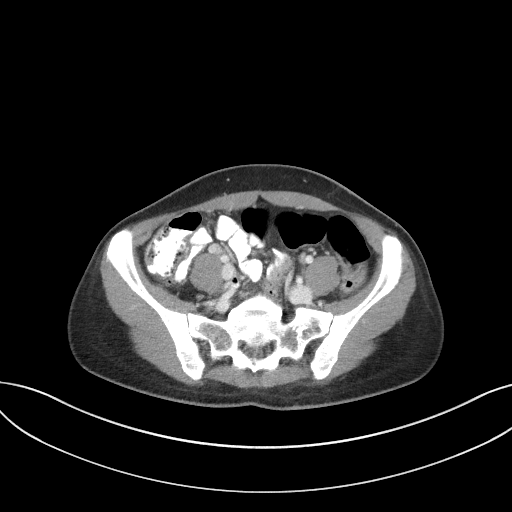
[im 45/86  soft-tissue]
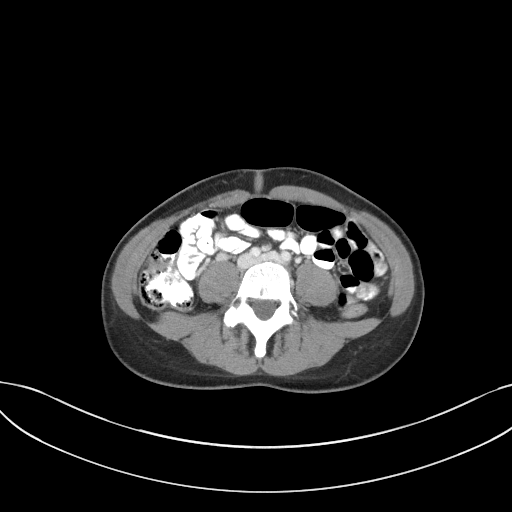
[im 50/86  soft-tissue]
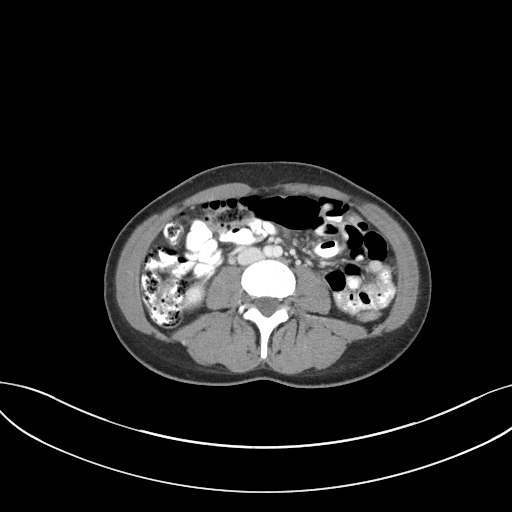
[im 54/86  soft-tissue]
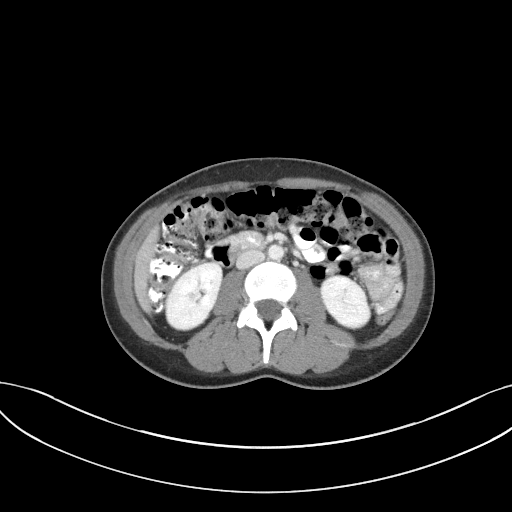
[im 54/86  bone]
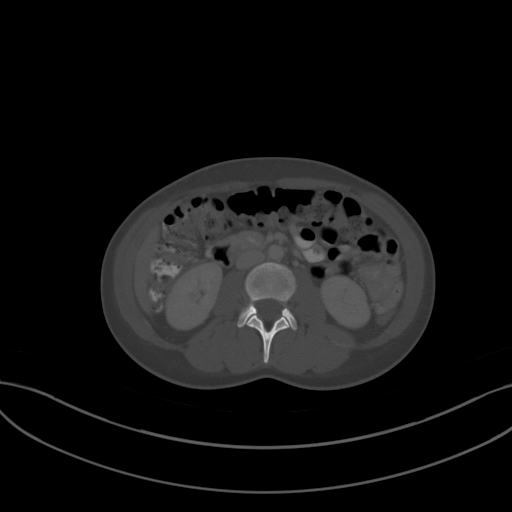
[im 63/86  soft-tissue]
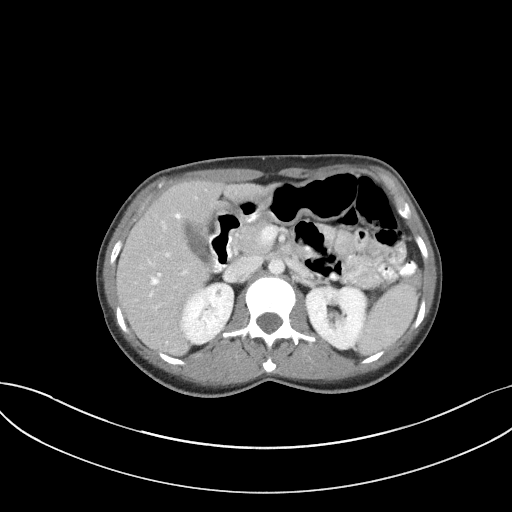
[im 68/86  soft-tissue]
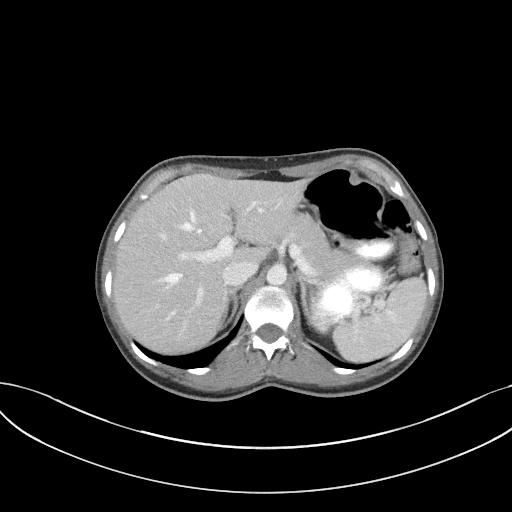
[im 72/86  soft-tissue]
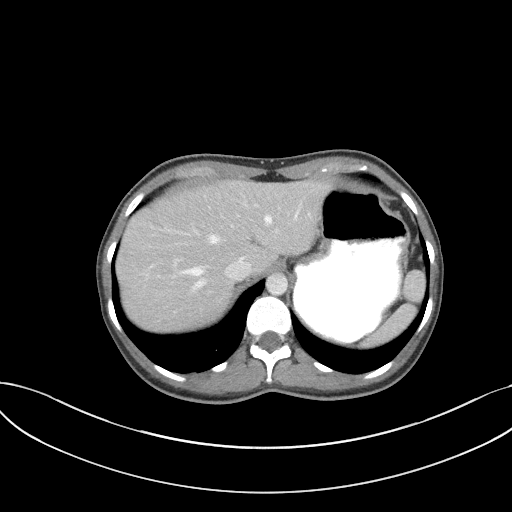
[im 81/86  soft-tissue]
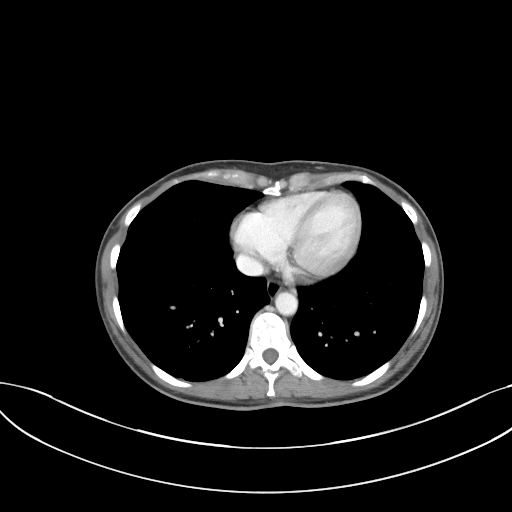

[Series 5: coronal st · coronal · 0.67mm/px · 3 of 71 slices shown]
[im 24/71  soft-tissue]
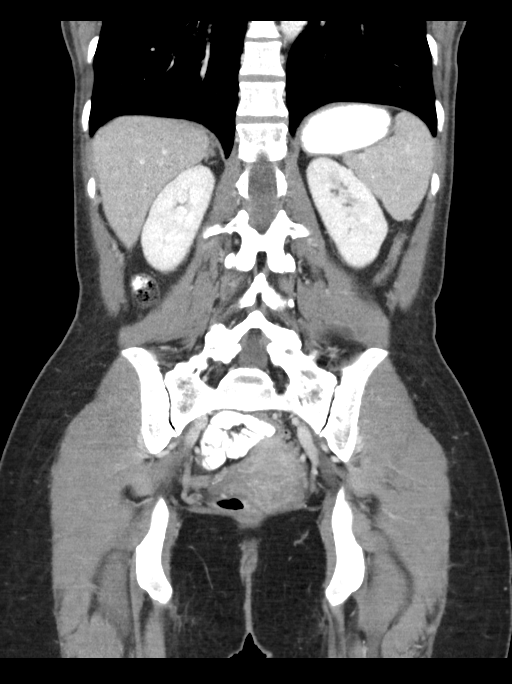
[im 32/71  soft-tissue]
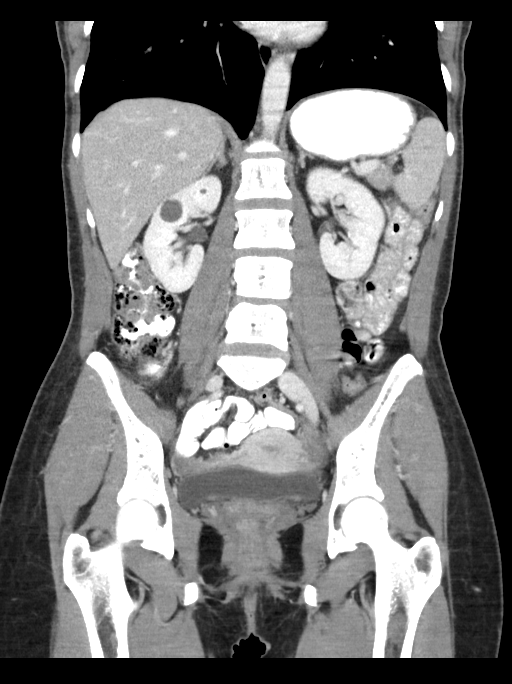
[im 39/71  soft-tissue]
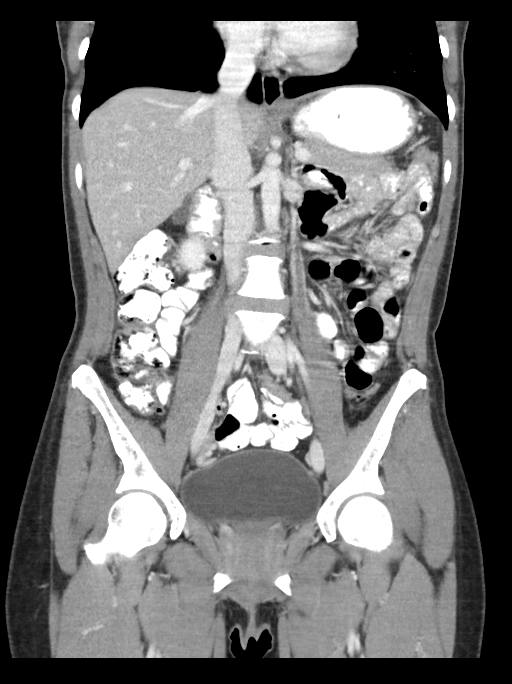

[16 of 46 positions shown; findings below may reference images not displayed]

FINDINGS: Lower chest: No acute abnormality.

Hepatobiliary: No solid liver abnormality is seen. No gallstones,
gallbladder wall thickening, or biliary dilatation.

Pancreas: Unremarkable. No pancreatic ductal dilatation or
surrounding inflammatory changes.

Spleen: Normal in size without significant abnormality.

Adrenals/Urinary Tract: Adrenal glands are unremarkable. Kidneys are
normal, without renal calculi, solid lesion, or hydronephrosis.
Bladder is unremarkable.

Stomach/Bowel: Stomach is within normal limits. Appendix appears
normal. No evidence of bowel wall thickening, distention, or
inflammatory changes.

Vascular/Lymphatic: No significant vascular findings are present. No
enlarged abdominal or pelvic lymph nodes.

Reproductive: No mass or other significant abnormality.

Other: No abdominal wall hernia or abnormality. Small volume
nonspecific free fluid in the low pelvis.

Musculoskeletal: No acute or significant osseous findings.
IMPRESSION: 1. No CT findings of the abdomen or pelvis to explain right-sided
abdominal pain, nausea, vomiting, or weight loss.
2. Small volume nonspecific free fluid in the low pelvis, likely
functional in the reproductive age setting.
